# Patient Record
Sex: Female | Born: 1975
Health system: Southern US, Community
[De-identification: ages and names within clinical notes are randomized; demographics above are authoritative.]

## PROBLEM LIST (undated history)

## (undated) DIAGNOSIS — N809 Endometriosis, unspecified: Secondary | ICD-10-CM

## (undated) DIAGNOSIS — N979 Female infertility, unspecified: Secondary | ICD-10-CM

## (undated) DIAGNOSIS — R740 Nonspecific elevation of levels of transaminase and lactic acid dehydrogenase [LDH]: Secondary | ICD-10-CM

## (undated) DIAGNOSIS — F419 Anxiety disorder, unspecified: Secondary | ICD-10-CM

## (undated) DIAGNOSIS — E785 Hyperlipidemia, unspecified: Secondary | ICD-10-CM

## (undated) HISTORY — DX: Female infertility, unspecified: N97.9

## (undated) HISTORY — DX: Hyperlipidemia, unspecified: E78.5

## (undated) HISTORY — DX: Endometriosis, unspecified: N80.9

## (undated) HISTORY — DX: Nonspecific elevation of levels of transaminase and lactic acid dehydrogenase (ldh): R74.0

## (undated) HISTORY — DX: Anxiety disorder, unspecified: F41.9

---

## 2002-02-25 ENCOUNTER — Encounter: Admission: RE | Admit: 2002-02-25 | Discharge: 2002-02-25 | Payer: Self-pay | Admitting: Obstetrics and Gynecology

## 2002-02-25 ENCOUNTER — Encounter: Payer: Self-pay | Admitting: Obstetrics and Gynecology

## 2002-03-30 ENCOUNTER — Encounter: Admission: RE | Admit: 2002-03-30 | Discharge: 2002-03-30 | Payer: Self-pay | Admitting: Obstetrics and Gynecology

## 2002-04-08 ENCOUNTER — Encounter: Payer: Self-pay | Admitting: Obstetrics and Gynecology

## 2002-04-08 ENCOUNTER — Encounter: Admission: RE | Admit: 2002-04-08 | Discharge: 2002-04-08 | Payer: Self-pay | Admitting: Obstetrics and Gynecology

## 2002-06-11 ENCOUNTER — Other Ambulatory Visit: Admission: RE | Admit: 2002-06-11 | Discharge: 2002-06-11 | Payer: Self-pay | Admitting: Obstetrics and Gynecology

## 2002-08-20 DIAGNOSIS — N809 Endometriosis, unspecified: Secondary | ICD-10-CM

## 2002-08-20 HISTORY — DX: Endometriosis, unspecified: N80.9

## 2002-08-20 HISTORY — PX: PELVIC LAPAROSCOPY: SHX162

## 2002-09-25 ENCOUNTER — Ambulatory Visit (HOSPITAL_COMMUNITY): Admission: RE | Admit: 2002-09-25 | Discharge: 2002-09-25 | Payer: Self-pay | Admitting: Obstetrics and Gynecology

## 2002-09-25 ENCOUNTER — Encounter: Payer: Self-pay | Admitting: Obstetrics and Gynecology

## 2003-08-21 DIAGNOSIS — N979 Female infertility, unspecified: Secondary | ICD-10-CM

## 2003-08-21 HISTORY — DX: Female infertility, unspecified: N97.9

## 2003-11-11 ENCOUNTER — Other Ambulatory Visit: Admission: RE | Admit: 2003-11-11 | Discharge: 2003-11-11 | Payer: Self-pay | Admitting: Obstetrics and Gynecology

## 2004-01-20 ENCOUNTER — Ambulatory Visit (HOSPITAL_COMMUNITY): Admission: RE | Admit: 2004-01-20 | Discharge: 2004-01-20 | Payer: Self-pay | Admitting: Obstetrics and Gynecology

## 2004-04-05 ENCOUNTER — Ambulatory Visit (HOSPITAL_COMMUNITY): Admission: RE | Admit: 2004-04-05 | Discharge: 2004-04-05 | Payer: Self-pay | Admitting: Obstetrics and Gynecology

## 2004-04-25 ENCOUNTER — Inpatient Hospital Stay (HOSPITAL_COMMUNITY): Admission: AD | Admit: 2004-04-25 | Discharge: 2004-04-25 | Payer: Self-pay | Admitting: Obstetrics and Gynecology

## 2004-04-27 ENCOUNTER — Inpatient Hospital Stay (HOSPITAL_COMMUNITY): Admission: AD | Admit: 2004-04-27 | Discharge: 2004-04-28 | Payer: Self-pay | Admitting: Obstetrics and Gynecology

## 2004-05-01 ENCOUNTER — Inpatient Hospital Stay (HOSPITAL_COMMUNITY): Admission: AD | Admit: 2004-05-01 | Discharge: 2004-05-05 | Payer: Self-pay | Admitting: Obstetrics and Gynecology

## 2004-05-01 ENCOUNTER — Encounter (INDEPENDENT_AMBULATORY_CARE_PROVIDER_SITE_OTHER): Payer: Self-pay | Admitting: *Deleted

## 2005-01-29 ENCOUNTER — Other Ambulatory Visit: Admission: RE | Admit: 2005-01-29 | Discharge: 2005-01-29 | Payer: Self-pay | Admitting: Obstetrics and Gynecology

## 2005-02-01 ENCOUNTER — Encounter: Admission: RE | Admit: 2005-02-01 | Discharge: 2005-02-01 | Payer: Self-pay | Admitting: Obstetrics and Gynecology

## 2010-09-10 ENCOUNTER — Encounter: Payer: Self-pay | Admitting: Obstetrics and Gynecology

## 2010-11-20 ENCOUNTER — Other Ambulatory Visit: Payer: Self-pay | Admitting: Obstetrics and Gynecology

## 2011-01-05 NOTE — Discharge Summary (Signed)
Brittany Hunt, Brittany Hunt            ACCOUNT NO.:  [id number]   MEDICAL RECORD NO.:  [id number]          PATIENT TYPE:  INP   LOCATION:  9318                          FACILITY:  [facility]   PHYSICIAN:  Ilda Mori, M.D.   DATE OF BIRTH:  Jun 22, 1976   DATE OF ADMISSION:  05/01/2004  DATE OF DISCHARGE:  05/05/2004                                 DISCHARGE SUMMARY   FINAL DIAGNOSES:  1.  Intrauterine pregnancy at 34 and one-seventh weeks gestation.  2.  Twin pregnancy.  3.  Preeclampsia.  4.  Postoperative anemia.   PROCEDURE:  Primary low transverse cesarean section.  Surgeon:  Dr. Conley Simmonds.  Assistant:  Dr. Carrington Clamp.  Complications:  None.   This 35 year old G1 P0 presents at 69 and one-seventh weeks gestation with a  known twin pregnancy from in vitro fertilization and worsening preeclampsia.  The patient's antepartum course up to this point of course was complicated  by her history of infertility and an in vitro fertilization pregnancy  resulting in twins.  The patient was monitored throughout the pregnancy and  growth had been concordant.  The patient had an early placenta previa that  of course resolved.  Then the patient started to develop some increased  blood pressures with some protein in her urine starting around 33 weeks.  The patient was able to be monitored closely as an outpatient.  She had  normal PIH labs with some sporadically elevated blood pressures that came  down with rest.  BPPs were 10/10 on the twins and they continued to have  concordant growth with normal amniotic fluid volumes.  During her pregnancy  she expressed her desires for a cesarean section for her method of delivery.  When the patient presented to the office September 12 she reported to have  some increasing headaches and general malaise.  She had a blood pressure of  150/100 with 4+ proteinuria and about a 5-pound weight gain in the last 3  days.  On exam, the patient had 3+ lower  extremity edema with clonus on the  right-hand side of one beat.  Cervix at this time was closed and long.  A  decision was made at this time to proceed with a cesarean section secondary  to this worsening preeclampsia and the patient did understand this would be  a preterm delivery.  The patient was taken to the operating room on  May 01, 2004 by Dr. Conley Simmonds where a primary low transverse  cesarean section was performed with the delivery of twin A was a female infant  weighing 5 pounds 5 ounces with Apgars of 7 and 7.  Fluid was noted to be  clear and the baby was taken to the NICU.  Twin B weighed 4 pounds 11  ounces, was also a female infant with Apgars of 8 and 9 and was taken to the  NICU as well.  Both babies were just having some retractions.  Otherwise,  delivery went without complications.  The patient's postoperative course was  complicated by some postoperative anemia which she was started on iron for.  The  patient was continued on her magnesium sulfate for 24 hours after  delivery and then was transferred to the mother-baby unit.  The patient was  felt ready for discharge on postoperative day #4.  Was sent home on a  regular diet, told to decrease activities, told to continue her iron  supplement, was given Tylox #25 one to two q.4h. as needed for pain, was to  follow up in the office in 4 weeks.  Blood pressures at this point were in  the normal range.  The patient, of course, was to call with any increased  pain, bleeding, or any problems.   LABORATORY DATA ON DISCHARGE:  The patient had a stable hemoglobin of 6.3,  white blood cell count of 9.3, and PIH labs that continued to be within the  normal range.      MB/MEDQ  D:  05/30/2004  T:  05/30/2004  Job:  [id number]

## 2011-01-05 NOTE — Op Note (Signed)
NAME:  Brittany Hunt, Brittany Hunt                      ACCOUNT NO.:  [id number]   MEDICAL RECORD NO.:  [id number]                   PATIENT TYPE:  INP   LOCATION:  9199                                 [facility]ACILITY:  [facility]   PHYSICIAN:  Randye Lobo, M.D.                DATE OF BIRTH:  12-Dec-1975   DATE OF PROCEDURE:  05/01/2004  DATE OF DISCHARGE:                                 OPERATIVE REPORT   PREOPERATIVE DIAGNOSES:  1.  34+ weeks.  2.  Twin gestation.  3.  Preeclampsia.   POSTOPERATIVE DIAGNOSES:  1.  Intrauterine gestation at 34+1 weeks.  2.  Twin gestation.  3.  Preeclampsia.   PROCEDURE:  Primary low segment transverse cesarean section.   SURGEON:  Randye Lobo, M.D.   ASSISTANT:  Carrington Clamp, M.D.   ANESTHESIA:  Spinal.   INTRAVENOUS FLUIDS:  __________ mL Ringer's lactate.   ESTIMATED BLOOD LOSS:  800 mL.   URINE OUTPUT:  200 mL.   COMPLICATIONS:  None.   INDICATIONS FOR THE PROCEDURE:  The patient is a 35 year old gravida 1, para  0, Caucasian female at 34+ 1 weeks' gestation.  [facility] June 11, 2004 with a  known twin pregnancy from in vitro fertilization, who presented to the  office with headache and malaise.  The patient had been followed closely as  an outpatient for development of lower extremity edema and proteinuria over  the prior week.  The patient had normal PIH labs and sporadically mildly  elevated blood pressure which came down with rest.  The patient had  reassuring testing of the twins on April 27, 2004 at which time they had  APPs which were 10/10.  The estimated fetal weight for baby A on April 27, 2004 was 2408 g and had normal amniotic fluid.  Estimated fetal weight  for twin B was 2218 g and the amniotic fluid volume was also noted to be  normal.   The patient during her pregnancy had expressed a desire to proceed with a  cesarean section for delivery.  In the office on May 01, 2004, the  patient reported increasing headache  and malaise.  She had a blood pressure  of 150/100.  The patient appeared to be edematous and had had a 5 pound  weight gain over the last 3 days.  Her urine dipped positive for 4+  proteinuria.  The patient had evidence of 3+ lower extremity edema and was  hyperreflexic with clonus on the right hand side of one beat.  The cervix  was noted to be closed and fixed.   At this time, an assessment was made that the patient had preeclampsia with  elevated blood pressure and a recommendation was  made to proceed with  cesarean delivery after risks, benefits, and alternatives were discussed  with her.  The patient did understand that this would be a preterm delivery.   FINDINGS:  Baby A  was a viable female delivered at 56.  The Apgars were 7 at  one minute and 7 at five minutes.  The presentation was vertex.  There was a  nuchal cord x1.  The amniotic fluid was noted to be clear.  The newborn did  develop retractions shortly after delivery and the decision was made to  transport to the neonatal intensive care unit.  A cord pH was obtained and  is pending at this time.   Baby B is a viable female infant which delivered at 63.  The Apgars were 8  at one minute and 9 at five minutes.  Presentation as a double footling  breach.  The amniotic fluid was noted to be clear.  The cord pH again is  pending at the time of this dictation.  The newborn initially went to the  newborn nursery and then shortly thereafter began to have retractions and  was taken to the intensive care unit.   The uterus, tubes, and ovaries were noted to be normal.   SPECIMENS:  The placenta was sent to pathology.   PROCEDURE IN DETAIL:  The patient was re-identified in the maternity  admissions area.  She was taken down to the operating room where spinal  anesthetic was administered.  The patient was then placed in a supine  position with a left lateral tilt.  The abdomen was sterilely prepped and a  Foley catheter was  sterilely placed inside the bladder.  She was sterilely  draped.   The patient was tested and noted to have adequate anesthesia before creating  the Pfannenstiel incision with a scalpel.  This was carried down to the  fascia using monopolar cautery.  The fascia was then incised in the midline  with the scalpel and the incision was carried out bilaterally using the Mayo  scissors.  The rectus muscles were dissected off of the overlying fascia  superiorly and inferiorly.  The parietal peritoneum was entered sharply  after it was elevated with two hemostat clamps.  The peritoneal incision was  extended cranially and caudally.   The bladder retractor was placed over the bladder and the lower uterine  segment was exposed.  The bladder flap was created sharply.  A transverse  lower uterine segment incision was created with a scalpel.  The incision was  extended bilaterally in an upward fashion using the bandage scissors.  Membranes were ruptured and Baby A was delivered in a vertex presentation.  The nuchal cord was reduced and the remainder of the infant was delivered  without difficulty.  The nares and mouth were suctioned and the newborn was  carried over to the awaiting pediatricians after the cord was doubly clamped  and cut.  Membranes were then ruptured for Baby B which presented as a  double footling breach.  The legs were delivered followed by the torso.  The  arms were swept across the chest easily and the head was delivered without  difficulty.  Again, the nares and mouth were suctioned and the cord was  doubly clamped and cut and the newborn was carried over to the waiting  pediatricians.   Cord pHs and cord blood were obtained from both of the cords.  The umbilical  cord for Baby A was marked with an umbilical clamp.  After the placenta was  manually extracted, it was sent to pathology.  The uterus was exteriorized for its closure at this time.  A moistened lap pad was used to  remove  any  remaining products of conception from within the uterine cavity and there  were none.  The uterine incision was closed with a double layer of closure  of #1 chromic.  The first was a running locked layer and the second was an  imbricating layer.  Hemostasis was created along the serosa in the  midportion of the incision with monopolar cautery.   The uterus was returned to the peritoneal cavity which was irrigated and  suctioned.  The uterine incision was noted to be hemostatic and the abdomen  was therefore closed at this time.  The parietal peritoneum was closed with  a running suture of 3-0 Vicryl.  The rectus muscles were reapproximated in  the midline with figure-of-eight sutures of #1 chromic.  The fascia was  closed with a running suture of 0 Vicryl.  The subcutaneous tissue was  irrigated and suctioned and made hemostatic with monopolar cautery.  Staples  were placed on the skin and a pressure dressing was placed over this.   This concluded the patient's procedure.  There were no complications.  All  needle, instrument, and sponge counts were correct.  The patient is escorted  to the recovery room in stable and awake condition.                                               Randye Lobo, M.D.    BES/MEDQ  D:  05/01/2004  T:  05/01/2004  Job:  [id number]

## 2011-10-23 ENCOUNTER — Ambulatory Visit
Admission: RE | Admit: 2011-10-23 | Discharge: 2011-10-23 | Disposition: A | Discharge status: Home / Self Care | Payer: BC Managed Care – PPO | Source: Ambulatory Visit | Attending: Physician Assistant | Admitting: Physician Assistant

## 2011-10-23 ENCOUNTER — Other Ambulatory Visit: Payer: Self-pay | Admitting: Physician Assistant

## 2011-10-23 DIAGNOSIS — M549 Dorsalgia, unspecified: Secondary | ICD-10-CM

## 2011-10-23 DIAGNOSIS — R0602 Shortness of breath: Secondary | ICD-10-CM

## 2012-01-31 ENCOUNTER — Other Ambulatory Visit: Payer: Self-pay | Admitting: Obstetrics and Gynecology

## 2012-01-31 DIAGNOSIS — N63 Unspecified lump in unspecified breast: Secondary | ICD-10-CM

## 2012-02-06 ENCOUNTER — Other Ambulatory Visit: Payer: Self-pay | Admitting: Obstetrics and Gynecology

## 2012-02-06 ENCOUNTER — Ambulatory Visit
Admission: RE | Admit: 2012-02-06 | Discharge: 2012-02-06 | Disposition: A | Discharge status: Home / Self Care | Payer: BC Managed Care – PPO | Source: Ambulatory Visit | Attending: Obstetrics and Gynecology | Admitting: Obstetrics and Gynecology

## 2012-02-06 DIAGNOSIS — N63 Unspecified lump in unspecified breast: Secondary | ICD-10-CM

## 2012-02-07 ENCOUNTER — Other Ambulatory Visit: Payer: Self-pay | Admitting: Obstetrics and Gynecology

## 2012-02-07 ENCOUNTER — Ambulatory Visit
Admission: RE | Admit: 2012-02-07 | Discharge: 2012-02-07 | Disposition: A | Discharge status: Home / Self Care | Payer: BC Managed Care – PPO | Source: Ambulatory Visit | Attending: Obstetrics and Gynecology | Admitting: Obstetrics and Gynecology

## 2012-02-07 ENCOUNTER — Other Ambulatory Visit: Payer: BC Managed Care – PPO

## 2012-02-07 DIAGNOSIS — N63 Unspecified lump in unspecified breast: Secondary | ICD-10-CM

## 2012-02-07 LAB — HM MAMMOGRAPHY

## 2012-02-08 ENCOUNTER — Other Ambulatory Visit: Payer: Self-pay | Admitting: Obstetrics and Gynecology

## 2012-02-08 ENCOUNTER — Ambulatory Visit
Admission: RE | Admit: 2012-02-08 | Discharge: 2012-02-08 | Disposition: A | Discharge status: Home / Self Care | Payer: BC Managed Care – PPO | Source: Ambulatory Visit | Attending: Obstetrics and Gynecology | Admitting: Obstetrics and Gynecology

## 2012-02-08 ENCOUNTER — Other Ambulatory Visit: Payer: BC Managed Care – PPO

## 2012-02-08 DIAGNOSIS — N63 Unspecified lump in unspecified breast: Secondary | ICD-10-CM

## 2012-02-25 ENCOUNTER — Other Ambulatory Visit: Payer: BC Managed Care – PPO

## 2012-11-13 ENCOUNTER — Encounter: Payer: Self-pay | Admitting: Obstetrics and Gynecology

## 2012-11-13 ENCOUNTER — Ambulatory Visit (INDEPENDENT_AMBULATORY_CARE_PROVIDER_SITE_OTHER): Payer: BC Managed Care – PPO | Admitting: Obstetrics and Gynecology

## 2012-11-13 ENCOUNTER — Other Ambulatory Visit: Payer: Self-pay | Admitting: Obstetrics and Gynecology

## 2012-11-13 VITALS — BP 110/78 | Ht 64.0 in | Wt 144.0 lb

## 2012-11-13 DIAGNOSIS — N926 Irregular menstruation, unspecified: Secondary | ICD-10-CM

## 2012-11-13 DIAGNOSIS — Z23 Encounter for immunization: Secondary | ICD-10-CM

## 2012-11-13 DIAGNOSIS — N6320 Unspecified lump in the left breast, unspecified quadrant: Secondary | ICD-10-CM

## 2012-11-13 DIAGNOSIS — Z0000 Encounter for general adult medical examination without abnormal findings: Secondary | ICD-10-CM

## 2012-11-13 DIAGNOSIS — N939 Abnormal uterine and vaginal bleeding, unspecified: Secondary | ICD-10-CM

## 2012-11-13 DIAGNOSIS — N63 Unspecified lump in unspecified breast: Secondary | ICD-10-CM

## 2012-11-13 DIAGNOSIS — Z01419 Encounter for gynecological examination (general) (routine) without abnormal findings: Secondary | ICD-10-CM

## 2012-11-13 LAB — POCT URINALYSIS DIPSTICK
Blood, UA: NEGATIVE
Glucose, UA: NEGATIVE
Ketones, UA: NEGATIVE
Leukocytes, UA: NEGATIVE
Protein, UA: NEGATIVE

## 2012-11-13 MED ORDER — MEDROXYPROGESTERONE ACETATE 5 MG PO TABS: 5.0000 mg | ORAL_TABLET | Freq: Every day | ORAL | Status: DC

## 2012-11-13 MED ORDER — NORETHIN-ETH ESTRAD-FE BIPHAS 1 MG-10 MCG / 10 MCG PO TABS: 1.0000 | ORAL_TABLET | Freq: Every day | ORAL | Status: DC

## 2012-11-13 NOTE — Progress Notes (Signed)
Patient ID: Brittany Hunt, female   DOB: 1976/07/08, 37 y.o.   MRN: [id number]  37 y.o. MarriedCaucasian female   G2P2 here for annual exam.   Stopped OCPs 1 and 1/2 years ago - Yaz and Yasmin.  Was having chest discomfort.  Patient was stressed and anxious.  Later restarted OCPs and was on Tri-Legest Fe and did OK without symptoms.  Cycles are lengthening.  Patient noted a left breast lump last summer.  [facility] Result benign breast parenchyema.favor pseudoangiomatous stromal hyperplasia or hamartomatous lesion.    Patient's last menstrual period was 10/13/2012.          Sexually active: yes  The current method of family planning is rhythm method.    Exercising: jogging. Last mammogram:  June 2013 with ultrasound and biopsy.   SBE - does regularly. Last pap smear:  2012 - normal History of abnormal pap:   Remote history at about age 17.  Had colposcopy at [facility] [place] and follow up paps normal. Smoking:  No. Alcohol: 3 drinks per week. Last colonoscopy:  Never. Last Bone Density:  Never. Last tetanus shot:  Uncertain. Last cholesterol check:  Does with PCP - Corky Mull.  Normal.    Hgb:                Urine:  Normal.    No health maintenance topics applied.  Family History  Problem Relation Age of Onset  . Diabetes Mother   . Thyroid disease Mother   . Thyroid disease Sister   . Heart failure Maternal Grandmother   . Heart disease Paternal Grandmother     There is no problem list on file for this patient.   Past Medical History  Diagnosis Date  . Anxiety   . Endometriosis 2004  . Infertility, female 22    INVETRO @ [facility]    Past Surgical History  Procedure Laterality Date  . Cesarean section  2005    [facility]  . Pelvic laparoscopy  2004    Allergies: Codeine  Current Outpatient Prescriptions  Medication Sig Dispense Refill  . diphenhydramine-acetaminophen (TYLENOL PM) 25-500 MG TABS Take 1 tablet  by mouth at bedtime as needed.      . vitamin C (ASCORBIC ACID) 500 MG tablet Take 500 mg by mouth daily.       No current facility-administered medications for this visit.    ROS: Pertinent items are noted in HPI.  Exam:    BP 110/78  Ht 5\' 4"  (1.626 m)  Wt 144 lb (65.318 kg)  BMI 24.71 kg/m2  LMP 10/13/2012   Wt Readings from Last 3 Encounters:  11/13/12 144 lb (65.318 kg)     Ht Readings from Last 3 Encounters:  11/13/12 5\' 4"  (1.626 m)    General appearance: alert, cooperative and appears stated age Head: Normocephalic, without obvious abnormality, atraumatic Neck: no adenopathy, supple, symmetrical, trachea midline and thyroid not enlarged, symmetric, no tenderness/mass/nodules Lungs: clear to auscultation bilaterally Breasts:Right -  Inspection negative, No nipple retraction or dimpling, No nipple discharge or bleeding, No axillary or supraclavicular adenopathy, Normal to palpation without dominant masses.  Left - palpable dense defined tissue at 12 o'clock, 2.5 cm diameter, smooth, mobile.  No retractions or axillary adenopathy.  (Patient is status post benign biopsy.) Heart: regular rate and rhythm Abdomen: soft, non-tender; bowel sounds normal; no masses,  no organomegaly Extremities: extremities normal, atraumatic, no cyanosis or edema Skin: Skin  color, texture, turgor normal. No rashes or lesions Lymph nodes: Cervical, supraclavicular, and axillary nodes normal. No abnormal inguinal nodes palpated Neurologic: Grossly normal   Pelvic: External genitalia:  no lesions              Urethra:  normal appearing urethra with no masses, tenderness or lesions              Bartholins and Skenes: normal                 Vagina: normal appearing vagina with normal color and discharge, no lesions              Cervix: normal appearance              Pap taken: yes and HR HPV        Bimanual Exam:  Uterus:  uterus is normal size, shape, consistency and nontender                                       Adnexa: normal adnexa in size, nontender and no masses                                      Rectovaginal: Confirms                                      Anus:  normal sphincter tone, no lesions  UPT - negative.  A: well woman Left breast mass.  Status post biopsy with benign results. PMS symptoms. Abnormal menses. Family history of thyroid disease.     P: mammogram yearly.  Diagnostic mammogram ordered for June 2014. pap smear and HR HPV testing. Provera 5 mg po q day for 5 days to start menses. Then start  Lo LoEstrin fe.  2 sample packs.  Rx for 10 months. Check TSH. Tdap today. Return annually or prn     An After Visit Summary was printed and given to the patient.

## 2012-11-13 NOTE — Patient Instructions (Signed)

## 2012-11-18 ENCOUNTER — Telehealth: Payer: Self-pay | Admitting: Obstetrics and Gynecology

## 2012-11-18 NOTE — Telephone Encounter (Signed)
Spoke with Sunny Schlein at [facility] concerning order for patient. Bilateral Breast Diagnostic Mammogram. States patient can not have Bilateral done until August 2014. States that patient never returned for Left Breast Ultrasound last year 01/2012. Order needs to be put in for Left Breast Ultra Sound (Breast Asymmetry). Order needs to be put in for Left Breast Diagnostic Mammogram also. Please advise.  sue

## 2012-11-18 NOTE — Telephone Encounter (Signed)
Cara from the [facility] is calling about the order placed in epic for this patient. Please call.

## 2012-11-19 ENCOUNTER — Telehealth: Payer: Self-pay | Admitting: *Deleted

## 2012-11-19 LAB — IPS PAP TEST WITH HPV

## 2012-11-19 NOTE — Telephone Encounter (Signed)
Plan for bilateral diagnostic mammogram and left breast ultrasound as discussed with the [facility] on the phone now.

## 2012-11-19 NOTE — Telephone Encounter (Signed)
Patient notified of appointment at the [facility] for Bilateral Diagnostic Mammogram and Left Breast Ultrasound  For February 16, 2013 @ 8am. sue

## 2012-11-19 NOTE — Telephone Encounter (Signed)
Spoke with Brittany Hunt at [facility] and Dr. Edward Jolly discussed with Brittany Hunt the need for order of Bilateral Diagnostic Mammogram and Left Breast Ultrasound to be done. Brittany Hunt researched patient chart through [facility] along with Dr. Edward Jolly and agreed these test were due to be done in June, 2014, patient scheduled for 02/16/2013 @ 8:00am @ [facility]. Patient notified of this.

## 2012-11-28 ENCOUNTER — Encounter: Payer: Self-pay | Admitting: Obstetrics and Gynecology

## 2012-12-02 ENCOUNTER — Telehealth: Payer: Self-pay | Admitting: Obstetrics and Gynecology

## 2012-12-02 NOTE — Telephone Encounter (Signed)
Pt called and she wants to go over the test results again just to make sure she is 100% clear on what they mean. Please call the patient.

## 2012-12-03 NOTE — Telephone Encounter (Signed)
Spoke with patient to answer any questions she had about pap results.  She states she feels much better after Reviewing Dr. Rica Records notes on my chart.  She didn't have any further questions.  Pt. Reassured.

## 2012-12-22 NOTE — Telephone Encounter (Signed)
Patient started new birth control pill and is having breakthrough bleeding/[place]

## 2012-12-22 NOTE — Telephone Encounter (Signed)
Brittany Hunt,  The breakthrough bleeding is not uncommon due to the missed pill, especially because patient is on an ultralow dose pill.  I recommend she take two pills a day for the rest of the pack, skip the placebo pills at the end, and then go directly into the next pack.  If she has difficulty with recurrent breakthrough bleeding, we may want to consider having her try a regular low dose pill with 20 mcg of estrogen in it.  Thanks,  [employer]

## 2012-12-22 NOTE — Telephone Encounter (Signed)
Patient was started on a low dose of new birth control pills. Started on pack 2 day 10 with break free bleeding like a normal cycle. Using maxipad x 2-3 days. Patient having no other symptoms. States has started a new exorcize program routine. Also stated she did miss a pill last Thursday but took the next morning when remembered and started the break through bleeding on the Friday. Please advise. sue

## 2012-12-23 NOTE — Telephone Encounter (Signed)
Patient notified of Dr. Edward Jolly response and instructions of taking of pills. Patient will call back if any problems. sue

## 2013-01-02 ENCOUNTER — Other Ambulatory Visit: Payer: Self-pay | Admitting: Obstetrics and Gynecology

## 2013-01-02 ENCOUNTER — Telehealth: Payer: Self-pay | Admitting: Obstetrics and Gynecology

## 2013-01-02 MED ORDER — NORETHIN ACE-ETH ESTRAD-FE 1-20 MG-MCG PO TABS: 1.0000 | ORAL_TABLET | Freq: Every day | ORAL | Status: DC

## 2013-01-02 NOTE — Telephone Encounter (Signed)
VM confirms correct name/LM new RX has been sent.call back if questions.

## 2013-01-02 NOTE — Telephone Encounter (Signed)
I will send a new Rx to her pharmacy to take LoEstrin 1/20.  Thanks,  [employer]

## 2013-01-02 NOTE — Telephone Encounter (Signed)
Patient having breakthrough bleeding. Please advise.

## 2013-01-02 NOTE — Telephone Encounter (Signed)
Patient reports BTB on "low dose pill"  And states that BTB stopped when she was taking the two OCP per day as Dr  Edward Jolly instructed her to do. Then once back to daily pill, BTB started 4 days later.  She has taken a second pill today and would like to have higher dose pill.

## 2013-02-16 ENCOUNTER — Telehealth: Payer: Self-pay | Admitting: Obstetrics and Gynecology

## 2013-02-16 NOTE — Telephone Encounter (Signed)
Spoke with pt about anxiety, irritability, and feeling on edge the week before her cycle starts. Symptoms are getting worse the older she gets. Pt wondering if there is anything BS would recommend for her to take.

## 2013-02-16 NOTE — Telephone Encounter (Signed)
Please have Brittany Hunt come in to discuss possible treatment for PMS symptoms.

## 2013-02-16 NOTE — Telephone Encounter (Signed)
Patient says she feels terrible the week before her cycle. Please call

## 2013-02-17 NOTE — Telephone Encounter (Signed)
Please contact patient to see if she has taken an SSRI in the past for PMS symptoms.  If she has taken and wants to retry this, I will prescribe by phone.  If has not taken this in the past, she will need an appointment to discuss and review medication with her.  I now see patients at 7:00 am for appointments.  Thank you,  Conley Simmonds

## 2013-02-17 NOTE — Telephone Encounter (Signed)
Call to patient, states she has not been on SSRI and is really not sure she wants this option.  Has heard many mixed messages about this.  States she is on OCP for cycle regulation and this is second month on higher pill due to BTB on LoLoestrin.  She states this is just like previous times on pills where she is miserable the week before cycle. So bad she almost wants to just stop pill.  Reports that off OCP, cycles are as frequent as Q21 days.  Advised that short of restarting a medication that she may have taken  In the past, she really needs to come in and discuss with Dr Edward Jolly the many different options.  Perhaps instead of treating PMS, we could look at treating her cycles, perhaps Mirena.  MD needs to be able to be able to explain all her options and let her choose.  Patient insistent that she simply does not have time due to work and vacation.  Advised that we have appts as early as 7 am.  If she really not happy with pill, could just finish this pack and not restart next pack and wait to come in to discuss.  Patient states she will see what she can work out and call back.

## 2013-02-17 NOTE — Telephone Encounter (Signed)
Spoke with pt about BS advice to come in and discuss options for PMS. Pt replied, "Oh no. I don't have time to come in. I live in [place] and just don't have much time off." Advised I would route the message back to Dr. Edward Jolly.

## 2013-04-14 ENCOUNTER — Ambulatory Visit
Admission: RE | Admit: 2013-04-14 | Discharge: 2013-04-14 | Disposition: A | Discharge status: Home / Self Care | Payer: BC Managed Care – PPO | Source: Ambulatory Visit | Attending: Obstetrics and Gynecology | Admitting: Obstetrics and Gynecology

## 2013-04-14 ENCOUNTER — Other Ambulatory Visit: Payer: Self-pay | Admitting: Obstetrics and Gynecology

## 2013-04-14 DIAGNOSIS — N6320 Unspecified lump in the left breast, unspecified quadrant: Secondary | ICD-10-CM

## 2013-06-25 ENCOUNTER — Other Ambulatory Visit: Payer: Self-pay

## 2014-03-25 IMAGING — MG MM BREAST STEREO BIOPSY LEFT
3 series · 3 of 3 positions shown · non-contrast
Comparison: Previous exams

***ADDENDUM*** CREATED: 02/08/2012 [DATE]
CLINICAL DATA: The patient presents for stereotactic guided core
biopsy of asymmetry in the left breast.

STEREOTACTIC-GUIDED VACUUM ASSISTED BIOPSY OF THE LEFT BREAST AND
SPECIMEN RADIOGRAPH

[L CC (1 of 2)]
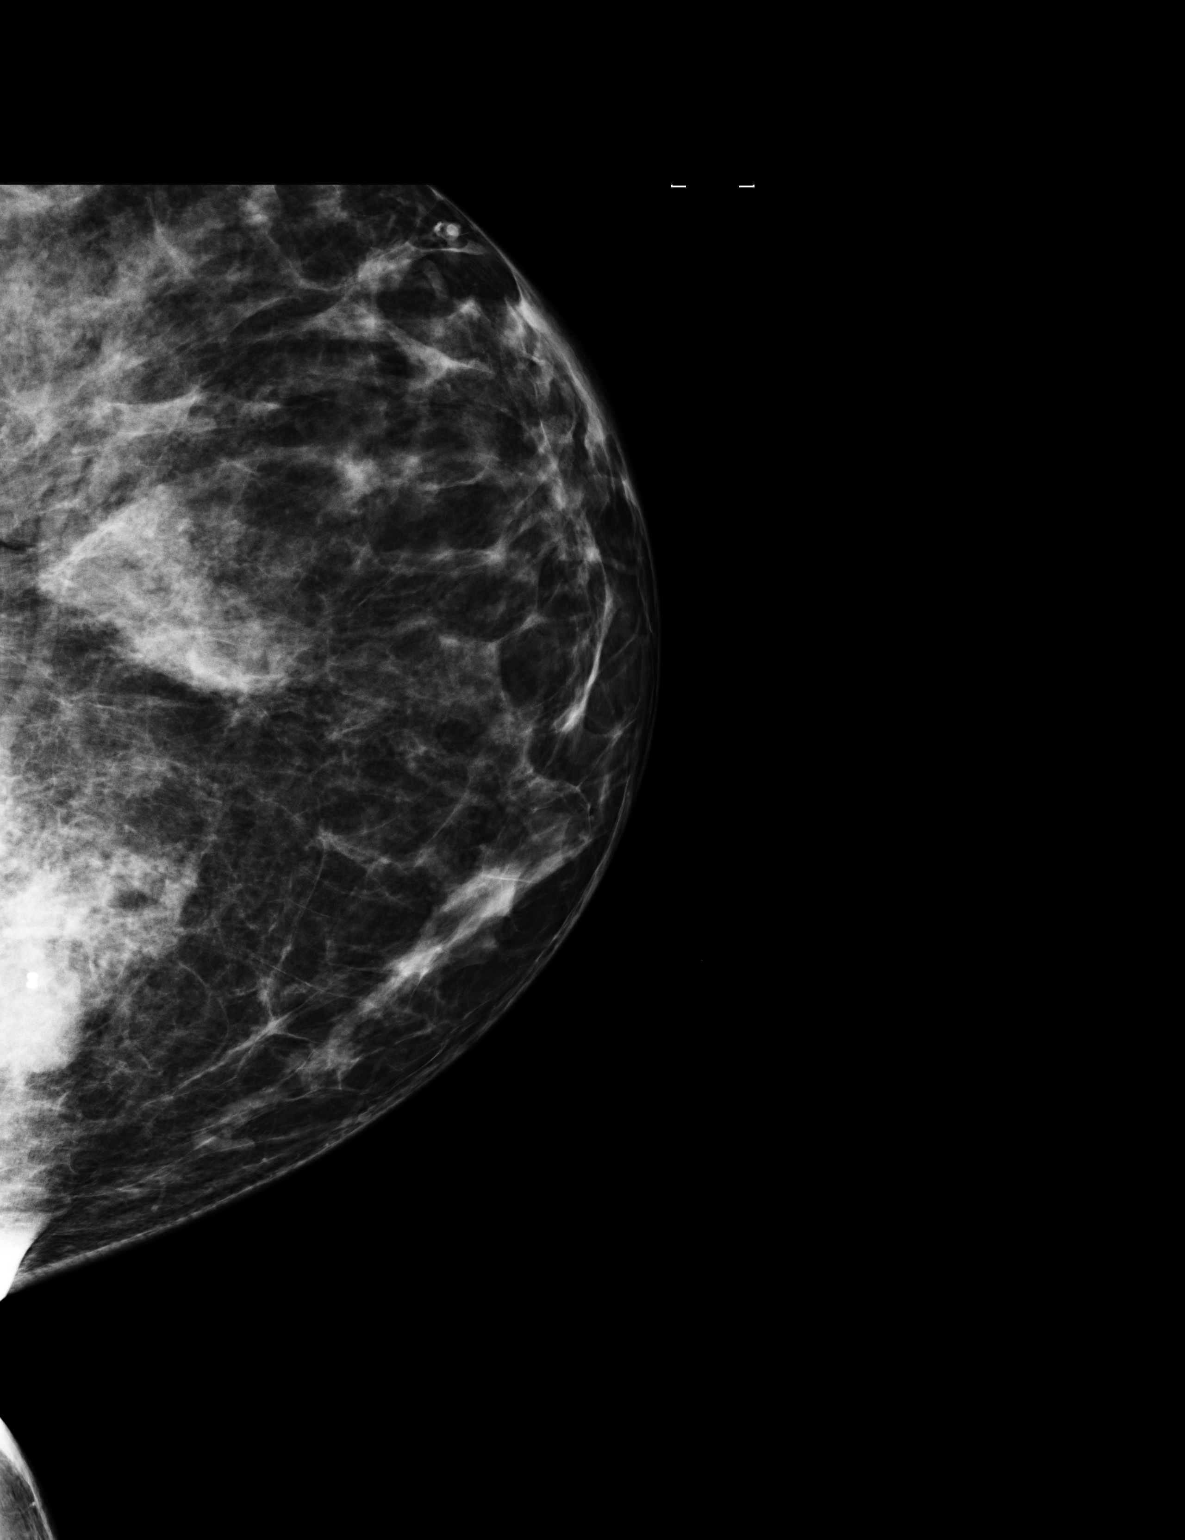

[L CC (2 of 2)]
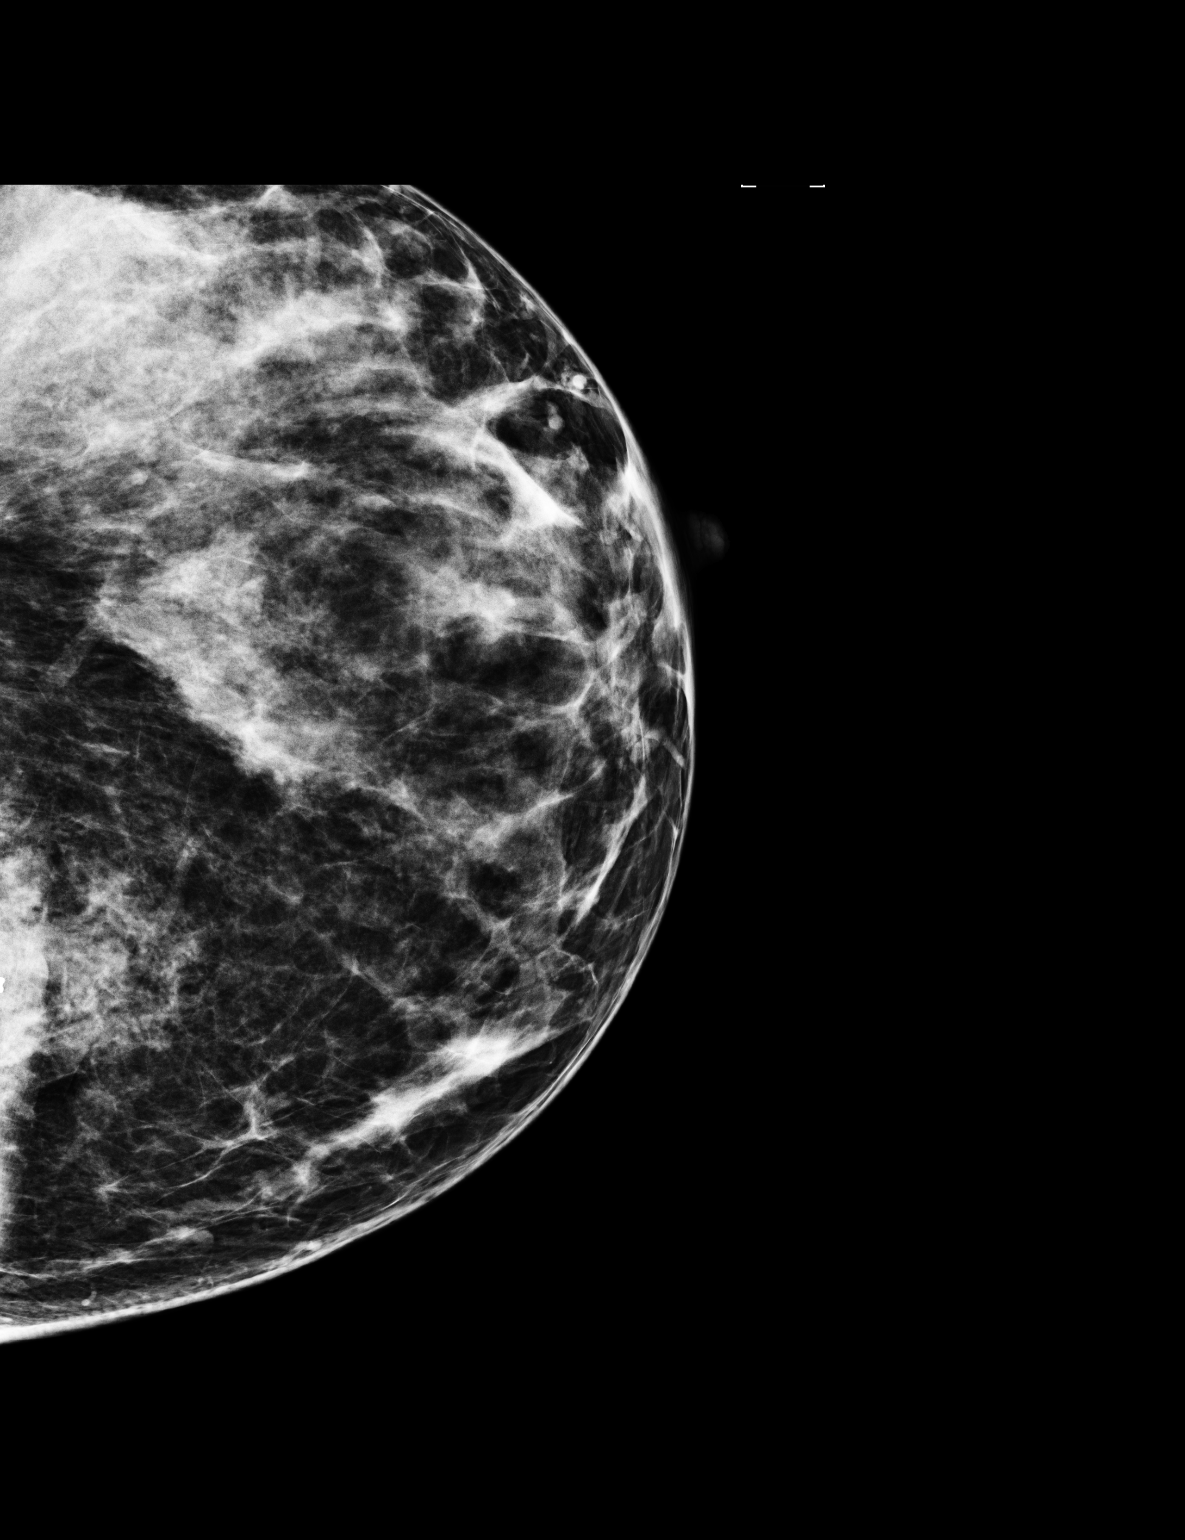

[L ML]
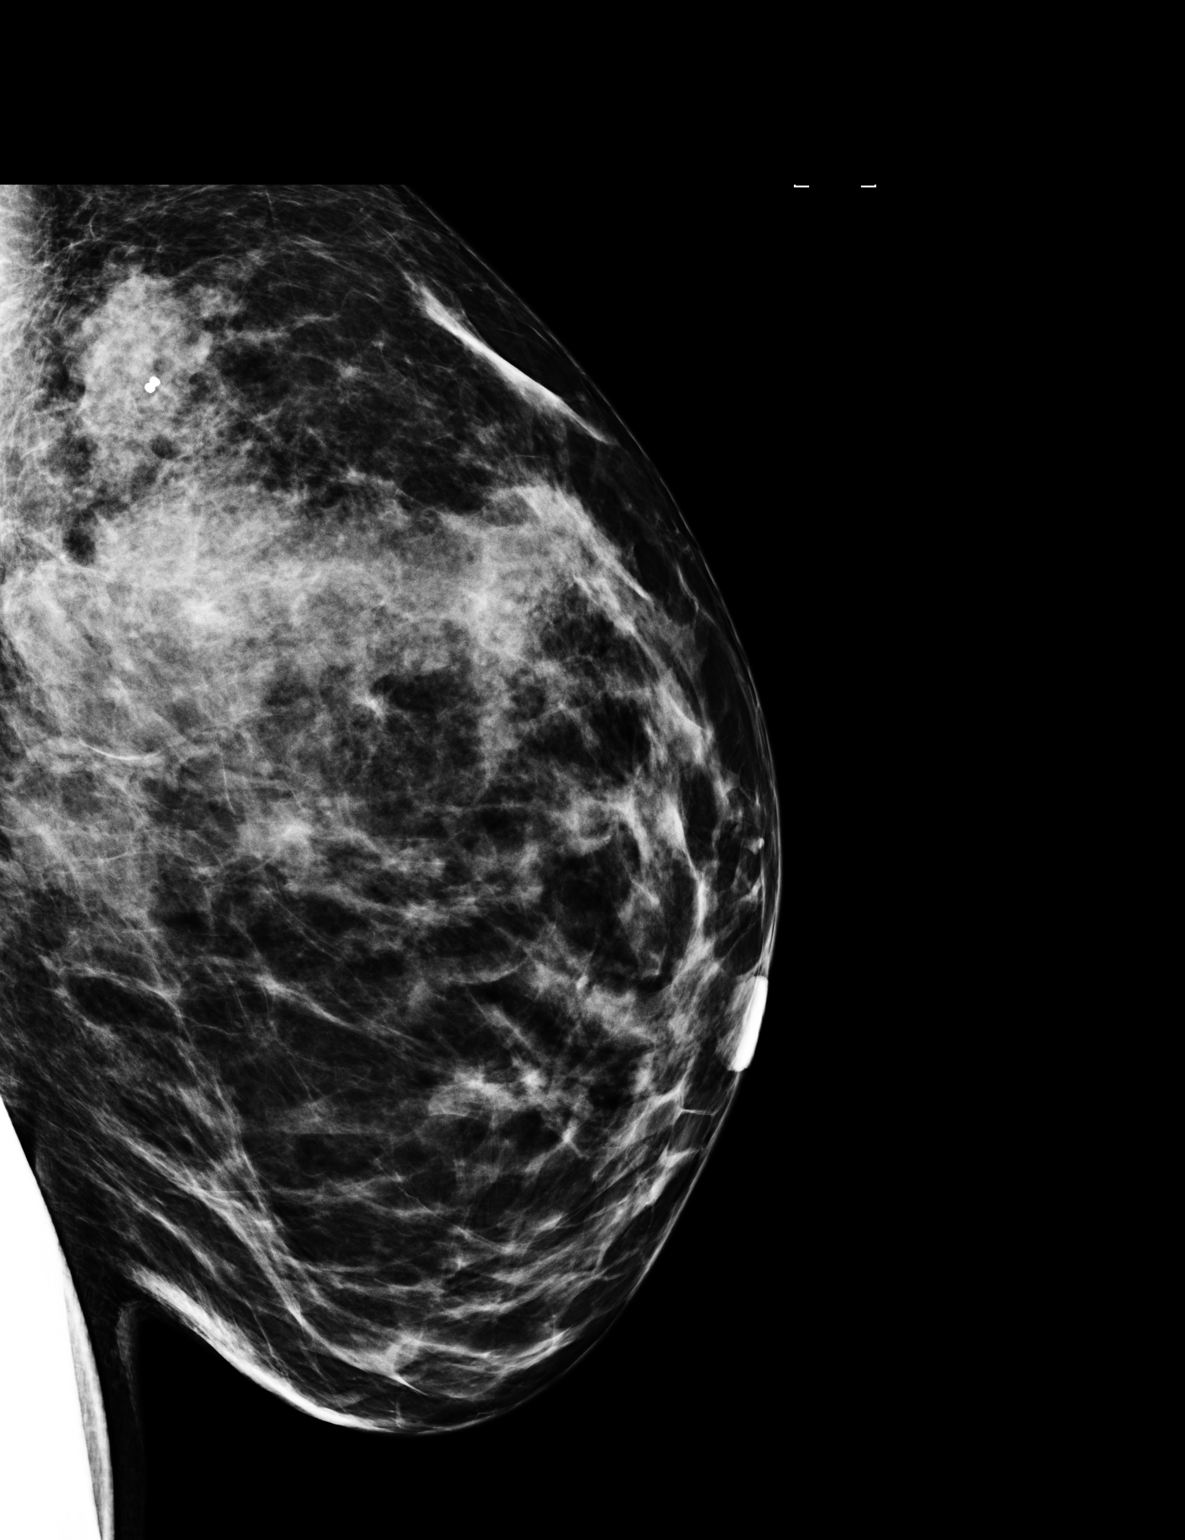

[3 of 3 positions shown; findings below may reference images not displayed]

FINDINGS: Post biopsy clip mammogram images of the left breast
were obtained when the patient returned for biopsy results on
02/08/2012.  These demonstrate a dumbbell shaped clip within the
previously demonstrated area of asymmetrical density.
IMPRESSION: Appropriate clip deployment following left breast stereotactic
guided core needle biopsy.

RECOMMENDATION:
Bilateral screening mammogram at age 38.

BI-RADS CATEGORY 2:  Benign finding(s).
I met with the patient and we discussed the procedure of
stereotactic-guided biopsy, including benefits and alternatives.
We discussed the high likelihood of a successful procedure. We
discussed the risks of the procedure, including infection,
bleeding, tissue injury, clip migration, and inadequate sampling.
Informed, written consent was given.

Using sterile technique, 2% lidocaine, stereotactic guidance, and a
9 gauge vacuum assisted device, biopsy was performed of asymmetry
in the medial portion of the left breast using a cephalad approach.
Compression was applied to measuring the stasis.  The patient has a
small palpable hematoma at the biopsy site.

At the conclusion of the procedure, a tissue marker clip was
deployed into the biopsy cavity. Clip films are deferred until
tomorrow because of small hematoma at the biopsy site.
IMPRESSION: Stereotactic-guided biopsy of left breast asymmetry.  No apparent
complications.

## 2014-06-21 ENCOUNTER — Encounter: Payer: Self-pay | Admitting: Obstetrics and Gynecology

## 2014-08-17 ENCOUNTER — Telehealth: Payer: Self-pay | Admitting: Obstetrics and Gynecology

## 2014-08-17 NOTE — Telephone Encounter (Signed)
Pt says she is not sure if Dr Edward JollySilva wanted to see her back in a year or five years for another exam.

## 2014-08-17 NOTE — Telephone Encounter (Signed)
Spoke with patient. Advised patient per last OV with Dr.Silva she recommends she return annually. Patient is agreeable and would like to schedule at this time. Requesting appointment for late April. Appointment scheduled for 12/16/14 at  8am. Patient agreeable to date and time.  Routing to provider for final review. Patient agreeable to disposition. Will close encounter

## 2014-08-20 DIAGNOSIS — E785 Hyperlipidemia, unspecified: Secondary | ICD-10-CM

## 2014-08-20 HISTORY — DX: Hyperlipidemia, unspecified: E78.5

## 2014-08-23 ENCOUNTER — Other Ambulatory Visit: Payer: Self-pay | Admitting: Obstetrics and Gynecology

## 2014-08-23 NOTE — Telephone Encounter (Signed)
Medication refill request: Junel Fe Last AEX:  11/13/12 Next AEX: 12/16/14 Last MMG (if hormonal medication request): 04/14/13 BIRADS2:Benign Refill authorized: #1pack/ 3R

## 2014-08-23 NOTE — Telephone Encounter (Signed)
Please move up the patient's appointment with me. I would like to see her in the next month.

## 2014-08-23 NOTE — Telephone Encounter (Signed)
eScribe request from [facility] for refill on LOESTRIN FE 1/20 Last filled - 01/02/13, #1 X 10 refills Last AEX - 11/13/12 Next AEX - 12/16/14 Last MMG - 04/14/13, diagnostic; repeat in one year or at age 40.  Pt turned 38 in December. Refills authorized:  1 pack x 3 refills

## 2014-08-24 ENCOUNTER — Telehealth: Payer: Self-pay | Admitting: Obstetrics and Gynecology

## 2014-08-24 ENCOUNTER — Other Ambulatory Visit: Payer: Self-pay

## 2014-08-24 ENCOUNTER — Other Ambulatory Visit: Payer: Self-pay | Admitting: Obstetrics and Gynecology

## 2014-08-24 DIAGNOSIS — Z1231 Encounter for screening mammogram for malignant neoplasm of breast: Secondary | ICD-10-CM

## 2014-08-24 MED ORDER — NORETHIN ACE-ETH ESTRAD-FE 1-20 MG-MCG PO TABS: 1.0000 | ORAL_TABLET | Freq: Every day | ORAL | Status: DC

## 2014-08-24 NOTE — Telephone Encounter (Signed)
Last annual 11/13/12 Next scheduled 11/2014 with Dr. Edward JollySilva for annual exam.   Patient states that she stopped ocp about 8 Months ago to see how her body was react to being off all medications. Patient states everything was just fine (cycles regular, q 24-26 days, normal flow) until 07/2014 when she had one heavy cycle 08/07/14 and then had another cycle 08/21/14, regular flow. Patient reports that she is now running every day and has been having a lot of stress with sister who is terminal.   Patient has annual exam 11/2014 with Dr. Edward JollySilva but does not want to continue with heavy and frequent cycles. Would like to restart ocp. Patient has new rx that was sent by our office, but states insurance does not cover 1 month at a time and needs rx for 3 month supply.   Patient is scheduled for office visit with Dr. Edward JollySilva 09/24/14 0945 to discuss. Would like 3 month ocp supply ordered.

## 2014-08-24 NOTE — Telephone Encounter (Signed)
Spoke with patient. She is agreeable.  [facility] [place].  Patient will start ocp with next cycle.  She would like to have thyroid checked at next visit, advised would note that in appointment information and she can discuss with Dr. Edward JollySilva at time of visit.  Patient agreeable to plan. Will call back with any increase in symptoms. Routing to provider for final review. Patient agreeable to disposition. Will close encounter

## 2014-08-24 NOTE — Telephone Encounter (Signed)
Pt states she has questions about her abnormal period. In December it was heavy with clotting ( clotting for 1day) and 14 days later started again but normal. Pt recently found out her sister has terminal lung cancer and pt also started new exercise routine recently. Thinks those stress/life changes might have contributed to this but wants to make sure. Pt sees Dr Edward JollySilva. Bf

## 2014-08-24 NOTE — Telephone Encounter (Signed)
Ok for 3 month supply of OCPs. Please send in to her pharmacy.

## 2014-08-26 ENCOUNTER — Ambulatory Visit: Payer: Self-pay

## 2014-08-27 ENCOUNTER — Telehealth: Payer: Self-pay | Admitting: Emergency Medicine

## 2014-08-27 NOTE — Telephone Encounter (Signed)
Out going call to patient. Call to reschedule appointment due to mistaken overbook of this RN to schedule.   Patient agreeable to r/s appointment. Needs mornings only. Scheduled for 09/29/14 at 1030.   Routing to provider for final review. Patient agreeable to disposition. Will close encounter

## 2014-09-02 ENCOUNTER — Telehealth: Payer: Self-pay | Admitting: Obstetrics and Gynecology

## 2014-09-02 NOTE — Telephone Encounter (Signed)
I do not see telephone note from anyone in our office. Patient has appointment with Dr.Silva scheduled for 09/29/14 at 10:30am. Has been notified of all past results. No current refill or other encounters open. Patient requests no return call.  Routing to provider for final review. Patient agreeable to disposition. Will close encounter

## 2014-09-02 NOTE — Telephone Encounter (Signed)
Pt has appt on 09/29/14 to see Dr. Edward JollySilva.  May need one more pack until that time.

## 2014-09-02 NOTE — Telephone Encounter (Signed)
Pt states she received a missed call from our office with no message left. Pt is confused why she received a call. Her next upcoming appointment is not until February.  Pt requests call back only if there is a reason we called her originally.  Pt requests we take out her home number and only use her mobile number. Updating now. bf

## 2014-09-24 ENCOUNTER — Ambulatory Visit: Payer: Self-pay | Admitting: Obstetrics and Gynecology

## 2014-09-27 ENCOUNTER — Telehealth: Payer: Self-pay | Admitting: Obstetrics and Gynecology

## 2014-09-27 NOTE — Telephone Encounter (Signed)
Patient cancelled her upcoming visit 09/29/13 with Dr. Edward JollySilva for irregular bleeding. She said, "Since starting the birth control my symptoms have gotten much better so I don't think I need the appointment."

## 2014-09-27 NOTE — Telephone Encounter (Signed)
Dr. Edward JollySilva,  Patient restarted OCPs in 08/2014 for heavy irregular periods.  Cycles much better since restarting OCPs and Has appointment for AEX 12-16-14.  Can she just keep AEX Appointment for f/u since she's doing well on OCPs?

## 2014-09-28 NOTE — Telephone Encounter (Signed)
OK to just keep appointment for annual exam with me in April! I am glad she is doing better.

## 2014-09-29 ENCOUNTER — Ambulatory Visit: Payer: Self-pay | Admitting: Obstetrics and Gynecology

## 2014-10-15 ENCOUNTER — Other Ambulatory Visit: Payer: Self-pay | Admitting: Obstetrics and Gynecology

## 2014-10-15 NOTE — Telephone Encounter (Signed)
rx was already sent for 3packs with 0 refills thru triage 1/16

## 2014-10-28 NOTE — Telephone Encounter (Signed)
Encounter closed by me.  Thank you.

## 2014-10-28 NOTE — Telephone Encounter (Signed)
Okay to close encounter?  °

## 2014-11-10 ENCOUNTER — Other Ambulatory Visit: Payer: Self-pay | Admitting: Obstetrics and Gynecology

## 2014-11-10 NOTE — Telephone Encounter (Signed)
Medication refill request: Junel Last AEX: 11/13/12 Dr. Edward JollySilva Next AEX: 12/16/14 Dr. Edward JollySilva Last MMG (if hormonal medication request): 04/14/13 Diagnostic bilateral BIRADS2:Benign  Refill authorized: 08/24/14 #[id number]/[id number]. Today please advise.

## 2014-11-11 NOTE — Telephone Encounter (Signed)
Please keep appointment for further birth control pill refills.

## 2014-12-15 ENCOUNTER — Other Ambulatory Visit: Payer: Self-pay | Admitting: Obstetrics and Gynecology

## 2014-12-15 NOTE — Telephone Encounter (Signed)
Rx denied patient had appointment scheduled for 12/16/14, patient rescheduled AEX for 04/21/15 (see phone note from Dr. Edward JollySilva below)  Routed to Dr. Marjorie SmolderSilva FYI   Brittany SallesBrook E Amundson C Silva, MD at 11/11/2014 3:47 AM     Status: Signed       Expand All Collapse All   Please keep appointment for further birth control pill refills.

## 2014-12-15 NOTE — Telephone Encounter (Signed)
Please let Brittany Hunt know that I really need and want to see her for her annual exam. (Please say hello!) I cannot continue to refill this Rx.  I am sure we can fit her in soon.  Please review calendar again.

## 2014-12-15 NOTE — Telephone Encounter (Signed)
Patient is requesting a refill for Junel fe to be [facility] to [facility] [address] In [place].

## 2014-12-15 NOTE — Telephone Encounter (Signed)
Patient reschedule appt 12/20/14 at 10:30am.

## 2014-12-15 NOTE — Telephone Encounter (Signed)
Dr. Edward JollySilva please advise, patient cancelled appt for 12/16/14 with you and is rescheduled for 04/21/15.

## 2014-12-16 ENCOUNTER — Ambulatory Visit: Payer: BC Managed Care – PPO | Admitting: Obstetrics and Gynecology

## 2014-12-20 ENCOUNTER — Ambulatory Visit
Admission: RE | Admit: 2014-12-20 | Discharge: 2014-12-20 | Disposition: A | Discharge status: Home / Self Care | Payer: BLUE CROSS/BLUE SHIELD | Source: Ambulatory Visit

## 2014-12-20 ENCOUNTER — Ambulatory Visit (INDEPENDENT_AMBULATORY_CARE_PROVIDER_SITE_OTHER): Payer: BLUE CROSS/BLUE SHIELD | Admitting: Obstetrics and Gynecology

## 2014-12-20 ENCOUNTER — Encounter: Payer: Self-pay | Admitting: Obstetrics and Gynecology

## 2014-12-20 VITALS — BP 120/82 | HR 88 | Resp 16 | Ht 63.75 in | Wt 148.0 lb

## 2014-12-20 DIAGNOSIS — Z0000 Encounter for general adult medical examination without abnormal findings: Secondary | ICD-10-CM | POA: Diagnosis not present

## 2014-12-20 DIAGNOSIS — Z1231 Encounter for screening mammogram for malignant neoplasm of breast: Secondary | ICD-10-CM

## 2014-12-20 DIAGNOSIS — Z01419 Encounter for gynecological examination (general) (routine) without abnormal findings: Secondary | ICD-10-CM | POA: Diagnosis not present

## 2014-12-20 LAB — CBC
HCT: 41 % (ref 36.0–46.0)
Hemoglobin: 13.5 g/dL (ref 12.0–15.0)
MCH: 29.8 pg (ref 26.0–34.0)
MCHC: 32.9 g/dL (ref 30.0–36.0)
MCV: 90.5 fL (ref 78.0–100.0)
MPV: 9.6 fL (ref 8.6–12.4)
PLATELETS: 335 10*3/uL (ref 150–400)
RBC: 4.53 MIL/uL (ref 3.87–5.11)
RDW: 13.8 % (ref 11.5–15.5)
WBC: 8.6 10*3/uL (ref 4.0–10.5)

## 2014-12-20 LAB — COMPREHENSIVE METABOLIC PANEL
ALBUMIN: 4 g/dL (ref 3.5–5.2)
ALT: 11 U/L (ref 0–35)
AST: 14 U/L (ref 0–37)
Alkaline Phosphatase: 61 U/L (ref 39–117)
BILIRUBIN TOTAL: 0.3 mg/dL (ref 0.2–1.2)
BUN: 11 mg/dL (ref 6–23)
CALCIUM: 9.6 mg/dL (ref 8.4–10.5)
CO2: 26 meq/L (ref 19–32)
Chloride: 103 mEq/L (ref 96–112)
Creat: 0.64 mg/dL (ref 0.50–1.10)
Glucose, Bld: 78 mg/dL (ref 70–99)
Potassium: 4.3 mEq/L (ref 3.5–5.3)
Sodium: 139 mEq/L (ref 135–145)
Total Protein: 7 g/dL (ref 6.0–8.3)

## 2014-12-20 LAB — LIPID PANEL
CHOL/HDL RATIO: 3.4 ratio
CHOLESTEROL: 175 mg/dL (ref 0–200)
HDL: 51 mg/dL (ref >=46)
LDL CALC: 75 mg/dL (ref 0–99)
TRIGLYCERIDES: 244 mg/dL — AB (ref <150)
VLDL: 49 mg/dL — ABNORMAL HIGH (ref 0–40)

## 2014-12-20 LAB — POCT URINALYSIS DIPSTICK
Bilirubin, UA: NEGATIVE
Blood, UA: NEGATIVE
Glucose, UA: NEGATIVE
Ketones, UA: NEGATIVE
Leukocytes, UA: NEGATIVE
Nitrite, UA: NEGATIVE
PH UA: 5
Protein, UA: NEGATIVE
UROBILINOGEN UA: NEGATIVE

## 2014-12-20 LAB — TSH: TSH: 1.314 u[IU]/mL (ref 0.350–4.500)

## 2014-12-20 MED ORDER — NORETHIN ACE-ETH ESTRAD-FE 1-20 MG-MCG PO TABS: 1.0000 | ORAL_TABLET | Freq: Every day | ORAL | Status: DC

## 2014-12-20 NOTE — Progress Notes (Signed)
39 y.o. G2P2 Married Caucasian female here for annual exam.    Had once heavy cycle recently.  Wants to continue with OCPs.  This controls cycle length.   Wants thyroid check.   History of left breast biopsy - benign.  PASH 2013.  Quit her job.  Feeling much less stress.  Working out.   Sister with terminal lung cancer.  Took her on vacation in April.   PCP: Milus HeightNoelle Redmon    Patient's last menstrual period was 12/16/2014.          Sexually active: Yes.    The current method of family planning is OCP (estrogen/progesterone).    Exercising: Yes.    Weights 3 x weekly, walking every day Smoker:  no  Health Maintenance: Pap: 11/13/12 ASC-US. HR HPV:Neg  History of abnormal Pap:  yes MMG:  04/14/13 BIRADS2:Benign. Has one scheduled for 12/22/14  Colonoscopy: Never BMD:   Never  Result   TDaP: 2014 Screening Labs:  Hb today: 13.7, Urine today: Clear   reports that she has never smoked. She has never used smokeless tobacco. She reports that she drinks about 6.0 oz of alcohol per week. She reports that she does not use illicit drugs.  Past Medical History  Diagnosis Date  . Anxiety   . Endometriosis 2004  . Infertility, female [id number]    INVETRO @ [facility]    Past Surgical History  Procedure Laterality Date  . Cesarean section  2005    [facility]  . Pelvic laparoscopy  2004    Current Outpatient Prescriptions  Medication Sig Dispense Refill  . ALPRAZolam (XANAX) 0.25 MG tablet Take 0.25 mg by mouth at bedtime.   0  . diphenhydramine-acetaminophen (TYLENOL PM) 25-500 MG TABS Take 1 tablet by mouth at bedtime as needed.    Colleen Can. JUNEL FE 1/20 1-20 MG-MCG tablet TAKE 1 TABLET BY MOUTH DAILY. 28 tablet 0   No current facility-administered medications for this visit.    Family History  Problem Relation Age of Onset  . Diabetes Mother   . Thyroid disease Mother   . Thyroid disease Sister   . Cancer Sister     Lung  . Heart failure Maternal Grandmother   . Heart disease Paternal  Grandmother     ROS:  Pertinent items are noted in HPI.  Otherwise, a comprehensive ROS was negative.  Exam:   BP 120/82 mmHg  Pulse 88  Resp 16  Ht 5' 3.75" (1.619 m)  Wt 148 lb (67.132 kg)  BMI 25.61 kg/m2  LMP 12/16/2014    General appearance: alert, cooperative and appears stated age Head: Normocephalic, without obvious abnormality, atraumatic Neck: no adenopathy, supple, symmetrical, trachea midline and thyroid normal to inspection and palpation Lungs: clear to auscultation bilaterally Breasts: normal appearance, no masses or tenderness, Inspection negative, No nipple retraction or dimpling, No nipple discharge or bleeding, No axillary or supraclavicular adenopathy Heart: regular rate and rhythm Abdomen: soft, non-tender; bowel sounds normal; no masses,  no organomegaly Extremities: extremities normal, atraumatic, no cyanosis or edema Skin: Skin color, texture, turgor normal. No rashes or lesions Lymph nodes: Cervical, supraclavicular, and axillary nodes normal. No abnormal inguinal nodes palpated Neurologic: Grossly normal  Pelvic: External genitalia:  no lesions              Urethra:  normal appearing urethra with no masses, tenderness or lesions              Bartholins and Skenes: normal  Vagina: normal appearing vagina with normal color and discharge, no lesions              Cervix: no lesions              Pap taken: Yes.   Bimanual Exam:  Uterus:  normal size, contour, position, consistency, mobility, non-tender              Adnexa: normal adnexa and no mass, fullness, tenderness              Rectovaginal: Yes.  .  Confirms.              Anus:  normal sphincter tone, no lesions  Chaperone was present for exam.  Assessment:   Well woman visit with normal exam. FH of hypothyroidism.  History of PASH of left breast.   Plan: Yearly mammogram recommended after age 61.  Recommended self breast exam.  Pap and HR HPV as above. Discussed Calcium,  Vitamin D, regular exercise program including cardiovascular and weight bearing exercise. Labs performed.  Yes.  .   See orders. Refills given on medications.  Yes.  .  See orders.  LoEstrin 1/20 Fe for one year.  Follow up annually and prn.   After visit summary provided.

## 2014-12-20 NOTE — Patient Instructions (Signed)

## 2014-12-21 ENCOUNTER — Other Ambulatory Visit: Payer: Self-pay | Admitting: Obstetrics and Gynecology

## 2014-12-21 DIAGNOSIS — E781 Pure hyperglyceridemia: Secondary | ICD-10-CM

## 2014-12-22 ENCOUNTER — Ambulatory Visit: Payer: Self-pay

## 2014-12-23 LAB — IPS PAP TEST WITH HPV

## 2015-01-03 ENCOUNTER — Other Ambulatory Visit: Payer: Self-pay

## 2015-01-03 DIAGNOSIS — Z1231 Encounter for screening mammogram for malignant neoplasm of breast: Secondary | ICD-10-CM

## 2015-01-05 ENCOUNTER — Other Ambulatory Visit: Payer: BLUE CROSS/BLUE SHIELD

## 2015-04-21 ENCOUNTER — Ambulatory Visit: Payer: Self-pay | Admitting: Obstetrics and Gynecology

## 2015-09-14 ENCOUNTER — Other Ambulatory Visit: Payer: Self-pay | Admitting: Obstetrics and Gynecology

## 2015-09-14 NOTE — Telephone Encounter (Signed)
Medication refill request: Junel  Last AEX:  12-20-14  Next AEX: 12-30-15 Last MMG (if hormonal medication request): 12-20-14 WNL  Refill authorized: please advise

## 2015-11-21 ENCOUNTER — Ambulatory Visit
Admission: RE | Admit: 2015-11-21 | Discharge: 2015-11-21 | Disposition: A | Discharge status: Home / Self Care | Payer: BLUE CROSS/BLUE SHIELD | Source: Ambulatory Visit | Attending: Physician Assistant | Admitting: Physician Assistant

## 2015-11-21 ENCOUNTER — Other Ambulatory Visit: Payer: Self-pay | Admitting: Physician Assistant

## 2015-11-21 DIAGNOSIS — M25522 Pain in left elbow: Secondary | ICD-10-CM

## 2015-11-21 DIAGNOSIS — F39 Unspecified mood [affective] disorder: Secondary | ICD-10-CM | POA: Diagnosis not present

## 2015-11-21 DIAGNOSIS — Z23 Encounter for immunization: Secondary | ICD-10-CM | POA: Diagnosis not present

## 2015-11-21 DIAGNOSIS — Z0000 Encounter for general adult medical examination without abnormal findings: Secondary | ICD-10-CM | POA: Diagnosis not present

## 2015-12-30 ENCOUNTER — Ambulatory Visit: Payer: BLUE CROSS/BLUE SHIELD

## 2015-12-30 ENCOUNTER — Ambulatory Visit: Payer: BLUE CROSS/BLUE SHIELD | Admitting: Obstetrics and Gynecology

## 2016-01-16 ENCOUNTER — Other Ambulatory Visit: Payer: Self-pay | Admitting: Obstetrics and Gynecology

## 2016-01-17 NOTE — Telephone Encounter (Signed)
Left voice mail to call back 

## 2016-01-17 NOTE — Telephone Encounter (Signed)
Medication refill request: Junel  Last AEX:  12/20/14  Next AEX: 05/28/16 Dr. Edward JollySilva  Last MMG (if hormonal medication request): 12/21/14 BIRADS1:neg Refill authorized: 09/14/15 #[id number]. Today please advise.

## 2016-01-17 NOTE — Telephone Encounter (Signed)
Please move the patient's annual exam up with me. She is also due to a mammogram, I believe.

## 2016-01-18 NOTE — Telephone Encounter (Signed)
AEX rescheduled for 02/16/16 @3 :45pm.  Advised to have MMG done before appt.

## 2016-02-03 ENCOUNTER — Ambulatory Visit: Payer: BLUE CROSS/BLUE SHIELD | Admitting: Obstetrics and Gynecology

## 2016-02-16 ENCOUNTER — Ambulatory Visit: Payer: BLUE CROSS/BLUE SHIELD | Admitting: Obstetrics and Gynecology

## 2016-04-16 ENCOUNTER — Other Ambulatory Visit: Payer: Self-pay | Admitting: Obstetrics and Gynecology

## 2016-04-16 NOTE — Telephone Encounter (Signed)
Medication refill request: OCP Last AEX:  12-20-14  Next AEX: 05-23-16 Last MMG (if hormonal medication request): 12-21-14 WNL  Refill authorized: please advise  Sending to JJ since BS is out of office

## 2016-04-28 ENCOUNTER — Encounter: Payer: Self-pay | Admitting: Obstetrics and Gynecology

## 2016-05-07 NOTE — Progress Notes (Signed)
40 y.o. [id number] Married Caucasian female here for annual exam.    Some hot flashes.  Stopped OPCs in July for 2 -3 weeks and did not have a cycle. She did restart them.  Wants hormone testing.  Having decrease libido, emotional changes, night sweats.  Struggling with her weight.   Her sister had a stroke, MI and has a pacemaker. She also has DM.   Other sister has end stage lung cancer.   PCP: Milus HeightNoelle Redmon, PA-C    Patient's last menstrual period was 02/18/2016 (approximate).     Period Pattern: (!) Irregular      Sexually active: Yes.   female The current method of family planning is OCP (estrogen/progesterone) -- Junel Fe 1/20.    Exercising: Yes.    Cardio and weights Smoker:  no  Health Maintenance: Pap:  12-20-14 Neg:Neg HR HPV History of abnormal Pap:  Yes, remote history age 40. [facility] and follow up paps normal. MMG:  12-21-14 3D/Density C/Neg/BiRads1:[facility] Colonoscopy:  n/a BMD:   n/a  Result  n/a TDaP:  2014 Gardasil:   N/A   Screening Labs:  Hb today: 12.8, Urine today: Neg   reports that she has never smoked. She has never used smokeless tobacco. She reports that she drinks about 6.0 oz of alcohol per week . She reports that she does not use drugs.  Past Medical History:  Diagnosis Date  . Anxiety   . Endometriosis 2004  . Hyperlipidemia 2016  . Infertility, female [id number]   INVETRO @ [facility]    Past Surgical History:  Procedure Laterality Date  . CESAREAN SECTION  2005   [facility]  . PELVIC LAPAROSCOPY  2004    Current Outpatient Prescriptions  Medication Sig Dispense Refill  . ALPRAZolam (XANAX) 0.25 MG tablet Take 0.25 mg by mouth at bedtime.   0  . diphenhydramine-acetaminophen (TYLENOL PM) 25-500 MG TABS Take 1 tablet by mouth at bedtime as needed.    Colleen Can. JUNEL FE 1/20 1-20 MG-MCG tablet TAKE 1 TABLET BY MOUTH DAILY. 84 tablet 0   No current facility-administered medications for this visit.     Family History  Problem  Relation Age of Onset  . Diabetes Mother   . Thyroid disease Mother   . Thyroid disease Sister   . Cancer Sister     Lung  . Heart failure Maternal Grandmother   . Heart disease Paternal Grandmother   . Heart attack Sister [id number]    had pacemaker placed  . Diabetes Sister     ROS:  Pertinent items are noted in HPI.  Otherwise, a comprehensive ROS was negative.  Exam:   BP 120/70 (BP Location: Right Arm, Patient Position: Sitting, Cuff Size: Normal)   Pulse 80   Resp 16   Ht 5' 2.25" (1.581 m)   Wt 162 lb 6.4 oz (73.7 kg)   LMP 02/18/2016 (Approximate)   BMI 29.47 kg/m     General appearance: alert, cooperative and appears stated age Head: Normocephalic, without obvious abnormality, atraumatic Neck: no adenopathy, supple, symmetrical, trachea midline and thyroid normal to inspection and palpation Lungs: clear to auscultation bilaterally Breasts: normal appearance, no masses or tenderness, No nipple retraction or dimpling, No nipple discharge or bleeding, No axillary or supraclavicular adenopathy Heart: regular rate and rhythm Abdomen: soft, non-tender; no masses, no organomegaly Extremities: extremities normal, atraumatic, no cyanosis or edema Skin: Skin color, texture, turgor normal. No rashes or lesions Lymph nodes: Cervical, supraclavicular, and axillary nodes  normal. No abnormal inguinal nodes palpated Neurologic: Grossly normal  Pelvic: External genitalia:  no lesions              Urethra:  normal appearing urethra with no masses, tenderness or lesions              Bartholins and Skenes: normal                 Vagina: normal appearing vagina with normal color and discharge, no lesions              Cervix: no lesions              Pap taken: No. Bimanual Exam:  Uterus:  normal size, contour, position, consistency, mobility, non-tender              Adnexa: no mass, fullness, tenderness              Rectal exam: Yes.  .  Confirms.              Anus:  normal sphincter tone,  no lesions  Chaperone was present for exam.  Assessment:   Well woman visit with normal exam. Menopausal symptoms. Hyperlipidemia. Elevated triglycerides.  Plan: Yearly mammogram recommended after age 2.  Patient will schedule 3D.  Recommended self breast exam.  Pap and HR HPV as above. Discussed Calcium, Vitamin D, regular exercise program including cardiovascular and weight bearing exercise. Will do fasting labs and FSH/estradiol when off of OCPs for 2 weeks.  No refill on OCPs at this time.  If menopausal, may switch to HRT. MVI daily.  Discussed herbal remedies for menopausal symptoms.   Follow up annually and prn.       After visit summary provided.

## 2016-05-09 ENCOUNTER — Ambulatory Visit (INDEPENDENT_AMBULATORY_CARE_PROVIDER_SITE_OTHER): Payer: BLUE CROSS/BLUE SHIELD | Admitting: Obstetrics and Gynecology

## 2016-05-09 ENCOUNTER — Encounter: Payer: Self-pay | Admitting: Obstetrics and Gynecology

## 2016-05-09 VITALS — BP 120/70 | HR 80 | Resp 16 | Ht 62.25 in | Wt 162.4 lb

## 2016-05-09 DIAGNOSIS — Z0000 Encounter for general adult medical examination without abnormal findings: Secondary | ICD-10-CM

## 2016-05-09 DIAGNOSIS — N951 Menopausal and female climacteric states: Secondary | ICD-10-CM | POA: Diagnosis not present

## 2016-05-09 DIAGNOSIS — Z01419 Encounter for gynecological examination (general) (routine) without abnormal findings: Secondary | ICD-10-CM | POA: Diagnosis not present

## 2016-05-09 LAB — POCT URINALYSIS DIPSTICK
BILIRUBIN UA: NEGATIVE
GLUCOSE UA: NEGATIVE
Ketones, UA: NEGATIVE
Leukocytes, UA: NEGATIVE
NITRITE UA: NEGATIVE
PH UA: 5
Protein, UA: NEGATIVE
RBC UA: NEGATIVE
Urobilinogen, UA: NEGATIVE

## 2016-05-09 NOTE — Patient Instructions (Signed)

## 2016-05-10 LAB — HEMOGLOBIN, FINGERSTICK: HEMOGLOBIN, FINGERSTICK: 12.8 g/dL (ref 12.0–16.0)

## 2016-05-23 ENCOUNTER — Ambulatory Visit: Payer: BLUE CROSS/BLUE SHIELD | Admitting: Obstetrics and Gynecology

## 2016-05-24 ENCOUNTER — Other Ambulatory Visit (INDEPENDENT_AMBULATORY_CARE_PROVIDER_SITE_OTHER): Payer: BLUE CROSS/BLUE SHIELD

## 2016-05-24 DIAGNOSIS — Z01419 Encounter for gynecological examination (general) (routine) without abnormal findings: Secondary | ICD-10-CM | POA: Diagnosis not present

## 2016-05-24 DIAGNOSIS — N951 Menopausal and female climacteric states: Secondary | ICD-10-CM

## 2016-05-24 DIAGNOSIS — Z0000 Encounter for general adult medical examination without abnormal findings: Secondary | ICD-10-CM

## 2016-05-24 LAB — LIPID PANEL
CHOL/HDL RATIO: 4.9 ratio (ref <=5.0)
Cholesterol: 251 mg/dL — ABNORMAL HIGH (ref 125–200)
HDL: 51 mg/dL (ref >=46)
LDL CALC: 135 mg/dL — AB (ref <130)
TRIGLYCERIDES: 324 mg/dL — AB (ref <150)
VLDL: 65 mg/dL — AB (ref <30)

## 2016-05-24 LAB — COMPREHENSIVE METABOLIC PANEL
ALBUMIN: 4.1 g/dL (ref 3.6–5.1)
ALK PHOS: 56 U/L (ref 33–115)
ALT: 21 U/L (ref 6–29)
AST: 22 U/L (ref 10–30)
BILIRUBIN TOTAL: 0.5 mg/dL (ref 0.2–1.2)
BUN: 11 mg/dL (ref 7–25)
CALCIUM: 9.2 mg/dL (ref 8.6–10.2)
CO2: 27 mmol/L (ref 20–31)
Chloride: 105 mmol/L (ref 98–110)
Creat: 0.63 mg/dL (ref 0.50–1.10)
Glucose, Bld: 96 mg/dL (ref 65–99)
POTASSIUM: 4.3 mmol/L (ref 3.5–5.3)
Sodium: 139 mmol/L (ref 135–146)
Total Protein: 6.5 g/dL (ref 6.1–8.1)

## 2016-05-24 LAB — CBC
HEMATOCRIT: 41.4 % (ref 35.0–45.0)
HEMOGLOBIN: 13.7 g/dL (ref 11.7–15.5)
MCH: 30.5 pg (ref 27.0–33.0)
MCHC: 33.1 g/dL (ref 32.0–36.0)
MCV: 92.2 fL (ref 80.0–100.0)
MPV: 9.8 fL (ref 7.5–12.5)
Platelets: 245 10*3/uL (ref 140–400)
RBC: 4.49 MIL/uL (ref 3.80–5.10)
RDW: 13.5 % (ref 11.0–15.0)
WBC: 5.4 10*3/uL (ref 3.8–10.8)

## 2016-05-24 LAB — TSH: TSH: 1.16 m[IU]/L

## 2016-05-25 ENCOUNTER — Encounter: Payer: Self-pay | Admitting: Obstetrics and Gynecology

## 2016-05-25 ENCOUNTER — Other Ambulatory Visit: Payer: Self-pay | Admitting: Obstetrics and Gynecology

## 2016-05-25 LAB — FOLLICLE STIMULATING HORMONE: FSH: 61.8 m[IU]/mL

## 2016-05-25 LAB — ESTRADIOL: Estradiol: 121 pg/mL

## 2016-05-25 MED ORDER — NORETHIN ACE-ETH ESTRAD-FE 1-20 MG-MCG PO TABS: 1.0000 | ORAL_TABLET | Freq: Every day | ORAL | 3 refills | Status: DC

## 2016-05-28 ENCOUNTER — Ambulatory Visit: Payer: BLUE CROSS/BLUE SHIELD | Admitting: Obstetrics and Gynecology

## 2016-09-17 ENCOUNTER — Encounter: Payer: BLUE CROSS/BLUE SHIELD | Admitting: Podiatry

## 2016-11-20 NOTE — Progress Notes (Signed)
This encounter was created in error - please disregard.

## 2017-01-08 DIAGNOSIS — F39 Unspecified mood [affective] disorder: Secondary | ICD-10-CM | POA: Diagnosis not present

## 2017-01-08 DIAGNOSIS — Z0000 Encounter for general adult medical examination without abnormal findings: Secondary | ICD-10-CM | POA: Diagnosis not present

## 2017-01-08 DIAGNOSIS — Z8349 Family history of other endocrine, nutritional and metabolic diseases: Secondary | ICD-10-CM | POA: Diagnosis not present

## 2017-04-15 ENCOUNTER — Other Ambulatory Visit: Payer: Self-pay | Admitting: Obstetrics and Gynecology

## 2017-04-15 DIAGNOSIS — Z1231 Encounter for screening mammogram for malignant neoplasm of breast: Secondary | ICD-10-CM

## 2017-04-15 DIAGNOSIS — E781 Pure hyperglyceridemia: Secondary | ICD-10-CM | POA: Diagnosis not present

## 2017-04-15 NOTE — Telephone Encounter (Signed)
Spoke with patient and advised that Dr. Edward Jolly called in 1 month and patient will need to schedule MMG for any further refills.

## 2017-04-15 NOTE — Telephone Encounter (Signed)
Please have patient schedule her mammogram.  I will give a one month refill of her pills.

## 2017-04-15 NOTE — Telephone Encounter (Signed)
Medication refill request: OCP  Last AEX:  05-09-16  Next AEX: 05-15-17  Last MMG (if hormonal medication request): 12-20-14 WNL  Refill authorized: please advise

## 2017-04-29 DIAGNOSIS — L812 Freckles: Secondary | ICD-10-CM | POA: Diagnosis not present

## 2017-04-29 DIAGNOSIS — L820 Inflamed seborrheic keratosis: Secondary | ICD-10-CM | POA: Diagnosis not present

## 2017-04-29 DIAGNOSIS — L578 Other skin changes due to chronic exposure to nonionizing radiation: Secondary | ICD-10-CM | POA: Diagnosis not present

## 2017-04-29 DIAGNOSIS — L821 Other seborrheic keratosis: Secondary | ICD-10-CM | POA: Diagnosis not present

## 2017-05-02 ENCOUNTER — Ambulatory Visit
Admission: RE | Admit: 2017-05-02 | Discharge: 2017-05-02 | Disposition: A | Discharge status: Home / Self Care | Payer: BLUE CROSS/BLUE SHIELD | Source: Ambulatory Visit | Attending: Obstetrics and Gynecology | Admitting: Obstetrics and Gynecology

## 2017-05-02 DIAGNOSIS — Z1231 Encounter for screening mammogram for malignant neoplasm of breast: Secondary | ICD-10-CM | POA: Diagnosis not present

## 2017-05-13 NOTE — Progress Notes (Signed)
41 y.o. G71P0102 Married Caucasian female here for annual exam.    Some PMS symptoms.  Declines tx.some hot flashes at night.   ROS:  Urinary frequency and urgency (not new), weight gain.  Gained weight with sister passing away from lung cancer.   Otherwise comprehensive ROS negative.   PCP doing routine comprehensive labs, done 04/15/17.   This was done one month ago.  States her Tricor makes her feel tired.  Bought property in [place], [place].  PCP:  Milus Height, PA-C   Patient's last menstrual period was 05/08/2017 (exact date).     Period Cycle (Days): 30 Period Duration (Days): 3-4 days Period Pattern: Regular Menstrual Flow: Moderate Menstrual Control: Thin pad Menstrual Control Change Freq (Hours): every 3-4 hours Dysmenorrhea: (!) Mild Dysmenorrhea Symptoms: Other (Comment) (moodiness)     Sexually active: Yes.   female The current method of family planning is OCP (estrogen/progesterone)--Junel Fe 1/20.    Exercising: Yes.    Walk/jog 3 days/week, weights Smoker:  no  Health Maintenance: Pap: 12-20-14 Neg:Neg HR HPV, 11-13-12 ASCUS:Neg HR HPV History of abnormal Pap:  Yes, remote history age 78. Had colposcopy at [facility] and follow up paps normal. MMG: 05-02-17 Density C/Neg/BiRads1:TBC Colonoscopy:  n/a BMD:   n/a  Result  n/a TDaP:  2014 Gardasil:   no HIV:Neg Hep C:Neg Screening Labs:  Hb today: PCP    reports that she has never smoked. She has never used smokeless tobacco. She reports that she drinks about 3.6 oz of alcohol per week . She reports that she does not use drugs.  Past Medical History:  Diagnosis Date  . Anxiety   . Endometriosis 2004  . Hyperlipidemia 2016  . Infertility, female 46   INVETRO @ [facility]    Past Surgical History:  Procedure Laterality Date  . CESAREAN SECTION  2005   [facility]  . PELVIC LAPAROSCOPY  2004    Current Outpatient Prescriptions  Medication Sig Dispense Refill  . ALPRAZolam (XANAX) 0.25 MG tablet Take 0.25 mg by  mouth at bedtime.   0  . diphenhydramine-acetaminophen (TYLENOL PM) 25-500 MG TABS Take 1 tablet by mouth at bedtime as needed.    . fenofibrate (TRICOR) 145 MG tablet Take 1 tablet by mouth daily.  1  . JUNEL FE 1/20 1-20 MG-MCG tablet TAKE 1 TABLET BY MOUTH DAILY. 1 Package 0   No current facility-administered medications for this visit.     Family History  Problem Relation Age of Onset  . Diabetes Mother   . Thyroid disease Mother   . Thyroid disease Sister   . Cancer Sister        Dec Lung Ca age 38  . Heart failure Maternal Grandmother   . Heart disease Paternal Grandmother   . Heart attack Sister 47       had pacemaker placed  . Diabetes Sister 54       Insulin dependent  . Breast cancer Neg Hx     ROS:  Pertinent items are noted in HPI.  Otherwise, a comprehensive ROS was negative.  Exam:   BP 122/80 (BP Location: Right Arm, Patient Position: Sitting, Cuff Size: Normal)   Pulse 80   Resp 20   Ht 5' 3.75" (1.619 m)   Wt 159 lb (72.1 kg)   LMP 05/08/2017 (Exact Date)   BMI 27.51 kg/m     General appearance: alert, cooperative and appears stated age Head: Normocephalic, without obvious abnormality, atraumatic Neck: no adenopathy, supple, symmetrical, trachea  midline and thyroid normal to inspection and palpation Lungs: clear to auscultation bilaterally Breasts: normal appearance, no masses or tenderness, No nipple retraction or dimpling, No nipple discharge or bleeding, No axillary or supraclavicular adenopathy Heart: regular rate and rhythm Abdomen: soft, non-tender; no masses, no organomegaly Extremities: extremities normal, atraumatic, no cyanosis or edema Skin: Skin color, texture, turgor normal. No rashes or lesions Lymph nodes: Cervical, supraclavicular, and axillary nodes normal. No abnormal inguinal nodes palpated Neurologic: Grossly normal  Pelvic: External genitalia:  no lesions              Urethra:  normal appearing urethra with no masses, tenderness  or lesions              Bartholins and Skenes: normal                 Vagina: normal appearing vagina with normal color and discharge, no lesions              Cervix: no lesions.  Menstrual flow - light brown.               Pap taken: Yes.   Bimanual Exam:  Uterus:  normal size, contour, position, consistency, mobility, non-tender              Adnexa: no mass, fullness, tenderness              Rectal exam: Yes.  .  Confirms.              Anus:  normal sphincter tone, no lesions  Chaperone was present for exam.  Assessment:   Well woman visit with normal exam. Hx of abnormal pap with remote hx colposcopy.  Hypercholesterolemia.  Treated.   Plan: Mammogram screening discussed. Recommended self breast awareness. Pap and HR HPV as above. Guidelines for Calcium, Vitamin D, regular exercise program including cardiovascular and weight bearing exercise. Refill of OCPs for one year. Follow up annually and prn.    After visit summary provided.

## 2017-05-15 ENCOUNTER — Encounter: Payer: Self-pay | Admitting: Obstetrics and Gynecology

## 2017-05-15 ENCOUNTER — Other Ambulatory Visit (HOSPITAL_COMMUNITY)
Admission: RE | Admit: 2017-05-15 | Discharge: 2017-05-15 | Disposition: A | Discharge status: Home / Self Care | Payer: BLUE CROSS/BLUE SHIELD | Source: Ambulatory Visit | Attending: Obstetrics and Gynecology | Admitting: Obstetrics and Gynecology

## 2017-05-15 ENCOUNTER — Ambulatory Visit (INDEPENDENT_AMBULATORY_CARE_PROVIDER_SITE_OTHER): Payer: BLUE CROSS/BLUE SHIELD | Admitting: Obstetrics and Gynecology

## 2017-05-15 VITALS — BP 122/80 | HR 80 | Resp 20 | Ht 63.75 in | Wt 159.0 lb

## 2017-05-15 DIAGNOSIS — Z01419 Encounter for gynecological examination (general) (routine) without abnormal findings: Secondary | ICD-10-CM | POA: Insufficient documentation

## 2017-05-15 MED ORDER — NORETHIN ACE-ETH ESTRAD-FE 1-20 MG-MCG PO TABS
1.0000 | ORAL_TABLET | Freq: Every day | ORAL | 3 refills | Status: DC
Start: 2017-05-15 — End: 2018-04-04

## 2017-05-15 NOTE — Patient Instructions (Signed)

## 2017-05-17 LAB — CYTOLOGY - PAP
Diagnosis: NEGATIVE
HPV (WINDOPATH): NOT DETECTED

## 2017-07-08 DIAGNOSIS — R300 Dysuria: Secondary | ICD-10-CM | POA: Diagnosis not present

## 2017-07-08 DIAGNOSIS — Z3200 Encounter for pregnancy test, result unknown: Secondary | ICD-10-CM | POA: Diagnosis not present

## 2017-07-16 DIAGNOSIS — F39 Unspecified mood [affective] disorder: Secondary | ICD-10-CM | POA: Diagnosis not present

## 2017-07-16 DIAGNOSIS — E7800 Pure hypercholesterolemia, unspecified: Secondary | ICD-10-CM | POA: Diagnosis not present

## 2017-07-16 DIAGNOSIS — Z23 Encounter for immunization: Secondary | ICD-10-CM | POA: Diagnosis not present

## 2017-08-20 DIAGNOSIS — R7401 Elevation of levels of liver transaminase levels: Secondary | ICD-10-CM

## 2017-08-20 HISTORY — DX: Elevation of levels of liver transaminase levels: R74.01

## 2017-09-05 DIAGNOSIS — L99 Other disorders of skin and subcutaneous tissue in diseases classified elsewhere: Secondary | ICD-10-CM | POA: Diagnosis not present

## 2017-09-05 DIAGNOSIS — L821 Other seborrheic keratosis: Secondary | ICD-10-CM | POA: Diagnosis not present

## 2017-09-05 DIAGNOSIS — L812 Freckles: Secondary | ICD-10-CM | POA: Diagnosis not present

## 2017-09-05 DIAGNOSIS — L578 Other skin changes due to chronic exposure to nonionizing radiation: Secondary | ICD-10-CM | POA: Diagnosis not present

## 2017-10-23 DIAGNOSIS — L821 Other seborrheic keratosis: Secondary | ICD-10-CM | POA: Diagnosis not present

## 2017-10-23 DIAGNOSIS — L578 Other skin changes due to chronic exposure to nonionizing radiation: Secondary | ICD-10-CM | POA: Diagnosis not present

## 2017-10-23 DIAGNOSIS — L820 Inflamed seborrheic keratosis: Secondary | ICD-10-CM | POA: Diagnosis not present

## 2018-01-09 DIAGNOSIS — F39 Unspecified mood [affective] disorder: Secondary | ICD-10-CM | POA: Diagnosis not present

## 2018-01-09 DIAGNOSIS — E7800 Pure hypercholesterolemia, unspecified: Secondary | ICD-10-CM | POA: Diagnosis not present

## 2018-01-09 DIAGNOSIS — I789 Disease of capillaries, unspecified: Secondary | ICD-10-CM | POA: Diagnosis not present

## 2018-01-09 DIAGNOSIS — Z0000 Encounter for general adult medical examination without abnormal findings: Secondary | ICD-10-CM | POA: Diagnosis not present

## 2018-01-09 DIAGNOSIS — L718 Other rosacea: Secondary | ICD-10-CM | POA: Diagnosis not present

## 2018-04-04 ENCOUNTER — Other Ambulatory Visit: Payer: Self-pay | Admitting: Obstetrics and Gynecology

## 2018-04-04 NOTE — Telephone Encounter (Signed)
Medication refill request: junel 1/20 fe Last AEX:  05-15-17 Next AEX: 05-23-18 Last MMG (if hormonal medication request): 05-02-17 category c density birads 1:neg Refill authorized: pt needs refill to get to aex. Please approve if appropriate.

## 2018-05-20 NOTE — Progress Notes (Deleted)
42 y.o. G46P0102 Married Caucasian female here for annual exam.    PCP:     No LMP recorded.           Sexually active: {yes no:314532}  The current method of family planning is {contraception:315051}.    Exercising: {yes no:314532}  {types:19826} Smoker:  {YES [id number]  Health Maintenance: Pap:  05/15/2017 normal History of abnormal Pap:  Yes, remote history age 34. Had colposcopy at [facility] and follow up paps normal. MMG:  05/02/2017 BI-RADS CATEGORY  1: Negative. Colonoscopy:  n/a BMD:   n/a  Result  n/a TDaP:  2014 Gardasil:   no [id number] Hep C:Negative Screening Labs:  Hb today: ***, Urine today: ***   reports that she has never smoked. She has never used smokeless tobacco. She reports that she drinks about 6.0 standard drinks of alcohol per week. She reports that she does not use drugs.  Past Medical History:  Diagnosis Date  . Anxiety   . Endometriosis 2004  . Hyperlipidemia 2016  . Infertility, female 64   INVETRO @ [facility]    Past Surgical History:  Procedure Laterality Date  . CESAREAN SECTION  2005   [facility]  . PELVIC LAPAROSCOPY  2004    Current Outpatient Medications  Medication Sig Dispense Refill  . ALPRAZolam (XANAX) 0.25 MG tablet Take 0.25 mg by mouth at bedtime.   0  . diphenhydramine-acetaminophen (TYLENOL PM) 25-500 MG TABS Take 1 tablet by mouth at bedtime as needed.    . fenofibrate (TRICOR) 145 MG tablet Take 1 tablet by mouth daily.  1  . JUNEL FE 1/20 1-20 MG-MCG tablet TAKE 1 TABLET BY MOUTH EVERY DAY 84 tablet 0   No current facility-administered medications for this visit.     Family History  Problem Relation Age of Onset  . Diabetes Mother   . Thyroid disease Mother   . Thyroid disease Sister   . Cancer Sister        Dec Lung Ca age 88  . Heart failure Maternal Grandmother   . Heart disease Paternal Grandmother   . Heart attack Sister 45       had pacemaker placed  . Diabetes Sister 20       Insulin dependent  . Breast  cancer Neg Hx     Review of Systems  Constitutional: Negative.   HENT: Negative.   Eyes: Negative.   Respiratory: Negative.   Cardiovascular: Negative.   Gastrointestinal: Negative.   Endocrine: Negative.   Genitourinary: Negative.   Musculoskeletal: Negative.   Skin: Negative.   Allergic/Immunologic: Negative.   Neurological: Negative.   Hematological: Negative.   Psychiatric/Behavioral: Negative.   All other systems reviewed and are negative.   Exam:   There were no vitals taken for this visit.    General appearance: alert, cooperative and appears stated age Head: Normocephalic, without obvious abnormality, atraumatic Neck: no adenopathy, supple, symmetrical, trachea midline and thyroid normal to inspection and palpation Lungs: clear to auscultation bilaterally Breasts: normal appearance, no masses or tenderness, No nipple retraction or dimpling, No nipple discharge or bleeding, No axillary or supraclavicular adenopathy Heart: regular rate and rhythm Abdomen: soft, non-tender; no masses, no organomegaly Extremities: extremities normal, atraumatic, no cyanosis or edema Skin: Skin color, texture, turgor normal. No rashes or lesions Lymph nodes: Cervical, supraclavicular, and axillary nodes normal. No abnormal inguinal nodes palpated Neurologic: Grossly normal  Pelvic: External genitalia:  no lesions  Urethra:  normal appearing urethra with no masses, tenderness or lesions              Bartholins and Skenes: normal                 Vagina: normal appearing vagina with normal color and discharge, no lesions              Cervix: no lesions              Pap taken: {yes no:314532} Bimanual Exam:  Uterus:  normal size, contour, position, consistency, mobility, non-tender              Adnexa: no mass, fullness, tenderness              Rectal exam: {yes no:314532}.  Confirms.              Anus:  normal sphincter tone, no lesions  Chaperone was present for  exam.  Assessment:   Well woman visit with normal exam.   Plan: Mammogram screening. Recommended self breast awareness. Pap and HR HPV as above. Guidelines for Calcium, Vitamin D, regular exercise program including cardiovascular and weight bearing exercise.   Follow up annually and prn.   Additional counseling given.  {yes [id number]. _______ minutes face to face time of which over 50% was spent in counseling.    After visit summary provided.

## 2018-05-23 ENCOUNTER — Ambulatory Visit: Payer: BLUE CROSS/BLUE SHIELD | Admitting: Obstetrics and Gynecology

## 2018-05-27 ENCOUNTER — Ambulatory Visit: Payer: BLUE CROSS/BLUE SHIELD | Admitting: Obstetrics and Gynecology

## 2018-06-10 ENCOUNTER — Encounter: Payer: Self-pay | Admitting: Obstetrics and Gynecology

## 2018-06-10 ENCOUNTER — Ambulatory Visit (INDEPENDENT_AMBULATORY_CARE_PROVIDER_SITE_OTHER): Payer: BLUE CROSS/BLUE SHIELD | Admitting: Obstetrics and Gynecology

## 2018-06-10 ENCOUNTER — Other Ambulatory Visit: Payer: Self-pay

## 2018-06-10 VITALS — BP 110/82 | HR 86 | Resp 14 | Ht 63.75 in | Wt 156.0 lb

## 2018-06-10 DIAGNOSIS — R748 Abnormal levels of other serum enzymes: Secondary | ICD-10-CM

## 2018-06-10 DIAGNOSIS — Z01419 Encounter for gynecological examination (general) (routine) without abnormal findings: Secondary | ICD-10-CM | POA: Diagnosis not present

## 2018-06-10 DIAGNOSIS — R350 Frequency of micturition: Secondary | ICD-10-CM | POA: Diagnosis not present

## 2018-06-10 DIAGNOSIS — R5383 Other fatigue: Secondary | ICD-10-CM | POA: Diagnosis not present

## 2018-06-10 LAB — POCT URINALYSIS DIPSTICK
Bilirubin, UA: NEGATIVE
Blood, UA: NEGATIVE
GLUCOSE UA: NEGATIVE
KETONES UA: NEGATIVE
Leukocytes, UA: NEGATIVE
Nitrite, UA: NEGATIVE
Protein, UA: NEGATIVE
Urobilinogen, UA: 0.2 E.U./dL
pH, UA: 5 (ref 5.0–8.0)

## 2018-06-10 MED ORDER — NORETHIN ACE-ETH ESTRAD-FE 1-20 MG-MCG PO TABS: 1.0000 | ORAL_TABLET | Freq: Every day | ORAL | 3 refills | Status: DC

## 2018-06-10 NOTE — Patient Instructions (Signed)

## 2018-06-10 NOTE — Progress Notes (Signed)
42 y.o. G7P0102 Married Caucasian female here for annual exam.    Went 5 months with no cycle, and then menses came back.  Mood swings.   Having some vaginal dryness.   Feels she has less focus.  Tired.   Urine dip - negative.   PCP:   Milus Height, PA-C  Patient's last menstrual period was 05/11/2018.     Period Duration (Days): 3 Period Pattern: (!) Irregular Menstrual Flow: Light Dysmenorrhea: None     Sexually active: Yes.    The current method of family planning is OCP (estrogen/progesterone).    Exercising: Yes.    cardio Smoker:  no  Health Maintenance: Pap:  05-15-17 negative, HR HPV negative           12-20-14 negative, HR HPV negative  History of abnormal Pap:  yes MMG:  05-02-17 density C/BIRADS 1 negative  Colonoscopy:  never BMD:   never  Result  n/a TDaP:  2014  Gardasil:   no HIV: done in past with fertility treatments Hep C: done in the past with fertility treatments Screening Labs:  Hb today: PCP, Urine today: urine dip normal Flu vaccine:  Done.    reports that she has never smoked. She has never used smokeless tobacco. She reports that she drinks about 6.0 standard drinks of alcohol per week. She reports that she does not use drugs.  Past Medical History:  Diagnosis Date  . Anxiety   . Endometriosis 2004  . Hyperlipidemia 2016  . Infertility, female 36   INVETRO @ [facility]    Past Surgical History:  Procedure Laterality Date  . CESAREAN SECTION  2005   [facility]  . PELVIC LAPAROSCOPY  2004    Current Outpatient Medications  Medication Sig Dispense Refill  . ALPRAZolam (XANAX) 0.25 MG tablet Take 0.25 mg by mouth at bedtime.   0  . diphenhydramine-acetaminophen (TYLENOL PM) 25-500 MG TABS Take 1 tablet by mouth at bedtime as needed.    . fenofibrate (TRICOR) 145 MG tablet Take 1 tablet by mouth daily.  1  . JUNEL FE 1/20 1-20 MG-MCG tablet TAKE 1 TABLET BY MOUTH EVERY DAY 84 tablet 0   No current facility-administered medications for this  visit.     Family History  Problem Relation Age of Onset  . Diabetes Mother   . Thyroid disease Mother   . Thyroid disease Sister   . Cancer Sister        Dec Lung Ca age 49  . Heart failure Maternal Grandmother   . Heart disease Paternal Grandmother   . Heart attack Sister 52       had pacemaker placed  . Diabetes Sister 67       Insulin dependent  . Breast cancer Neg Hx     Review of Systems  Constitutional: Negative.   HENT: Negative.   Eyes: Negative.   Respiratory: Negative.   Cardiovascular: Negative.   Gastrointestinal: Negative.   Endocrine: Negative.   Genitourinary: Positive for dysuria, frequency and menstrual problem.  Musculoskeletal: Negative.   Skin: Negative.   Allergic/Immunologic: Negative.   Neurological:       Lack of coordination   Hematological: Negative.   Psychiatric/Behavioral: Negative.     Exam:   BP 110/82 (BP Location: Right Arm, Patient Position: Sitting, Cuff Size: Normal)   Pulse 86   Resp 14   Ht 5' 3.75" (1.619 m)   Wt 156 lb (70.8 kg)   LMP 05/11/2018   BMI 26.99 kg/m  General appearance: alert, cooperative and appears stated age Head: Normocephalic, without obvious abnormality, atraumatic Neck: no adenopathy, supple, symmetrical, trachea midline and thyroid normal to inspection and palpation Lungs: clear to auscultation bilaterally Breasts: normal appearance, no masses or tenderness, No nipple retraction or dimpling, No nipple discharge or bleeding, No axillary or supraclavicular adenopathy Heart: regular rate and rhythm Abdomen: soft, non-tender; no masses, no organomegaly Extremities: extremities normal, atraumatic, no cyanosis or edema Skin: Skin color, texture, turgor normal. No rashes or lesions Lymph nodes: Cervical, supraclavicular, and axillary nodes normal. No abnormal inguinal nodes palpated Neurologic: Grossly normal  Pelvic: External genitalia:  no lesions              Urethra:  normal appearing urethra  with no masses, tenderness or lesions              Bartholins and Skenes: normal                 Vagina: normal appearing vagina with normal color and discharge, no lesions              Cervix: no lesions              Pap taken: No. Bimanual Exam:  Uterus:  normal size, contour, position, consistency, mobility, non-tender              Adnexa: no mass, fullness, tenderness              Rectal exam: Yes.  .  Confirms.              Anus:  normal sphincter tone, no lesions  Chaperone was present for exam.  Assessment:   Well woman visit with normal exam. Hx of abnormal pap with remote hx colposcopy.  Hypercholesterolemia.  Treated.  Fatigue.   Plan: Mammogram screening. Recommended self breast awareness. Pap and HR HPV as above. Guidelines for Calcium, Vitamin D, regular exercise program including cardiovascular and weight bearing exercise. Discussed Gardasil.  Will check CBC, CMP, vit D, testosterone per request.  Refill of OCPs for one year.    Follow up annually and prn.   After visit summary provided.

## 2018-06-13 ENCOUNTER — Encounter: Payer: Self-pay | Admitting: Obstetrics and Gynecology

## 2018-06-13 LAB — COMPREHENSIVE METABOLIC PANEL
ALT: 35 IU/L — AB (ref 0–32)
AST: 26 IU/L (ref 0–40)
Albumin/Globulin Ratio: 1.7 (ref 1.2–2.2)
Albumin: 4.4 g/dL (ref 3.5–5.5)
Alkaline Phosphatase: 43 IU/L (ref 39–117)
BUN/Creatinine Ratio: 18 (ref 9–23)
BUN: 12 mg/dL (ref 6–24)
Bilirubin Total: 0.3 mg/dL (ref 0.0–1.2)
CALCIUM: 9.6 mg/dL (ref 8.7–10.2)
CO2: 21 mmol/L (ref 20–29)
CREATININE: 0.66 mg/dL (ref 0.57–1.00)
Chloride: 101 mmol/L (ref 96–106)
GFR calc Af Amer: 125 mL/min/{1.73_m2} (ref >59)
GFR, EST NON AFRICAN AMERICAN: 109 mL/min/{1.73_m2} (ref >59)
GLOBULIN, TOTAL: 2.6 g/dL (ref 1.5–4.5)
GLUCOSE: 87 mg/dL (ref 65–99)
Potassium: 4.2 mmol/L (ref 3.5–5.2)
Sodium: 138 mmol/L (ref 134–144)
Total Protein: 7 g/dL (ref 6.0–8.5)

## 2018-06-13 LAB — CBC
HEMATOCRIT: 41.3 % (ref 34.0–46.6)
Hemoglobin: 13.8 g/dL (ref 11.1–15.9)
MCH: 29.9 pg (ref 26.6–33.0)
MCHC: 33.4 g/dL (ref 31.5–35.7)
MCV: 90 fL (ref 79–97)
Platelets: 324 10*3/uL (ref 150–450)
RBC: 4.61 x10E6/uL (ref 3.77–5.28)
RDW: 13.1 % (ref 12.3–15.4)
WBC: 7.8 10*3/uL (ref 3.4–10.8)

## 2018-06-13 LAB — TSH: TSH: 1.18 u[IU]/mL (ref 0.450–4.500)

## 2018-06-13 LAB — VITAMIN D 25 HYDROXY (VIT D DEFICIENCY, FRACTURES): Vit D, 25-Hydroxy: 34.5 ng/mL (ref 30.0–100.0)

## 2018-06-13 LAB — TESTOSTERONE, FREE, DIRECT
TESTOSTERONE FREE: 4.2 pg/mL (ref 0.0–4.2)
TESTOSTERONE, TOTAL: 23.7 ng/dL

## 2018-06-13 NOTE — Addendum Note (Signed)
Addended by: Ardell Isaacs, Kelyn Koskela E on: 06/13/2018 12:18 PM   Modules accepted: Orders

## 2018-08-27 ENCOUNTER — Telehealth: Payer: Self-pay | Admitting: Obstetrics and Gynecology

## 2018-08-27 NOTE — Telephone Encounter (Signed)
Please contact patient regarding her need for lab visit.  She came up in my Epic reminders.   She has an elevated liver enzyme.   Please schedule the lab visit.

## 2018-08-27 NOTE — Telephone Encounter (Signed)
Call to patient. Patient scheduled for lab visit on 09-03-2018 at 0840. Patient agreeable to date and time of appointment.   Routing to provider and will close encounter.

## 2018-09-03 ENCOUNTER — Telehealth: Payer: Self-pay | Admitting: Obstetrics and Gynecology

## 2018-09-03 ENCOUNTER — Other Ambulatory Visit: Payer: BLUE CROSS/BLUE SHIELD

## 2018-09-03 NOTE — Telephone Encounter (Signed)
Left message on voicemail regarding missed lab appointment. °

## 2018-09-03 NOTE — Telephone Encounter (Signed)
Thank you for reaching out to the patient.  

## 2019-01-14 DIAGNOSIS — Z0000 Encounter for general adult medical examination without abnormal findings: Secondary | ICD-10-CM | POA: Diagnosis not present

## 2019-01-14 DIAGNOSIS — E7800 Pure hypercholesterolemia, unspecified: Secondary | ICD-10-CM | POA: Diagnosis not present

## 2019-01-14 DIAGNOSIS — F39 Unspecified mood [affective] disorder: Secondary | ICD-10-CM | POA: Diagnosis not present

## 2019-04-06 ENCOUNTER — Telehealth: Payer: Self-pay

## 2019-04-06 NOTE — Telephone Encounter (Signed)
Received faxed refill request from [facility].  Medication refill request: Junel 1/20 #[id number] Last AEX:  06-10-18 Next AEX: 06-12-19 Last MMG (if hormonal medication request): 05-02-18 Refill authorized: Refused--1 year supply [facility] to [facility] 05/2018.

## 2019-06-12 ENCOUNTER — Ambulatory Visit: Payer: BLUE CROSS/BLUE SHIELD | Admitting: Obstetrics and Gynecology

## 2019-09-16 DIAGNOSIS — R5383 Other fatigue: Secondary | ICD-10-CM | POA: Diagnosis not present

## 2019-09-16 DIAGNOSIS — H9209 Otalgia, unspecified ear: Secondary | ICD-10-CM | POA: Diagnosis not present

## 2019-09-16 DIAGNOSIS — R5381 Other malaise: Secondary | ICD-10-CM | POA: Diagnosis not present

## 2019-09-16 DIAGNOSIS — M545 Low back pain: Secondary | ICD-10-CM | POA: Diagnosis not present

## 2019-09-16 DIAGNOSIS — R350 Frequency of micturition: Secondary | ICD-10-CM | POA: Diagnosis not present

## 2019-09-21 ENCOUNTER — Other Ambulatory Visit: Payer: Self-pay | Admitting: Obstetrics and Gynecology

## 2019-09-21 ENCOUNTER — Other Ambulatory Visit: Payer: Self-pay

## 2019-09-21 DIAGNOSIS — Z1231 Encounter for screening mammogram for malignant neoplasm of breast: Secondary | ICD-10-CM

## 2019-09-23 ENCOUNTER — Other Ambulatory Visit: Payer: Self-pay

## 2019-09-23 ENCOUNTER — Ambulatory Visit (INDEPENDENT_AMBULATORY_CARE_PROVIDER_SITE_OTHER): Payer: BC Managed Care – PPO | Admitting: Obstetrics and Gynecology

## 2019-09-23 ENCOUNTER — Encounter: Payer: Self-pay | Admitting: Obstetrics and Gynecology

## 2019-09-23 VITALS — BP 100/60 | HR 88 | Temp 98.2°F | Resp 16 | Ht 64.25 in | Wt 165.0 lb

## 2019-09-23 DIAGNOSIS — Z01419 Encounter for gynecological examination (general) (routine) without abnormal findings: Secondary | ICD-10-CM | POA: Diagnosis not present

## 2019-09-23 DIAGNOSIS — Z0000 Encounter for general adult medical examination without abnormal findings: Secondary | ICD-10-CM | POA: Diagnosis not present

## 2019-09-23 LAB — POCT URINALYSIS DIPSTICK
Bilirubin, UA: NEGATIVE
Blood, UA: NEGATIVE
Glucose, UA: NEGATIVE
Ketones, UA: NEGATIVE
Leukocytes, UA: NEGATIVE
Nitrite, UA: NEGATIVE
Protein, UA: NEGATIVE
Urobilinogen, UA: NEGATIVE E.U./dL — AB
pH, UA: 5 (ref 5.0–8.0)

## 2019-09-23 MED ORDER — NORETHIN ACE-ETH ESTRAD-FE 1-20 MG-MCG PO TABS: 1.0000 | ORAL_TABLET | Freq: Every day | ORAL | 3 refills | Status: DC

## 2019-09-23 NOTE — Progress Notes (Signed)
44 y.o. G31P0102 Married Caucasian female here for annual exam.    She stopped her pill for a period of time.  It helps control her low back pain. She wants to be back on this.  Treated for UTI at [place] recently and would like urine rechecked today. She had a negative Covid test also.   Urine Dip:neg  Working from home.   PCP: Lennie Odor, PA    Patient's last menstrual period was 09/16/2019 (exact date).           Sexually active: Yes.    The current method of family planning is none.    Exercising: Yes.    walks constantly at home and work Smoker:  no  Health Maintenance: Pap: 05-15-17 Neg:Neg HR HPV, 12-20-14 Neg:Neg HR HPV, 11-13-12 ASCUS:Neg HR HPV History of abnormal Pap:  Yes, Hx of cryotherapy age 52 MMG: 05-02-17 Neg/density C/BiRads1--appt.10-22-19 Colonoscopy:  n/a BMD:   n/a  Result  n/a TDaP:  2014 Gardasil:   no HIV: done in past with fertility treatments Hep C: done in the past with fertility treatments Screening Labs:  Today Flu vaccine:  Recommended.    reports that she has never smoked. She has never used smokeless tobacco. She reports current alcohol use of about 1.0 standard drinks of alcohol per week. She reports that she does not use drugs.  Past Medical History:  Diagnosis Date  . Anxiety   . Elevated ALT measurement 2019  . Endometriosis 2004  . Hyperlipidemia 2016  . Infertility, female 76   INVETRO @ [facility]    Past Surgical History:  Procedure Laterality Date  . CESAREAN SECTION  2005   [facility]  . PELVIC LAPAROSCOPY  2004    Current Outpatient Medications  Medication Sig Dispense Refill  . ALPRAZolam (XANAX) 0.25 MG tablet Take 0.25 mg by mouth at bedtime.   0  . fenofibrate (TRICOR) 145 MG tablet Take 1 tablet by mouth daily.  1  . Melatonin 10 MG CAPS Take 1 capsule by mouth at bedtime.    Marland Kitchen omega-3 acid ethyl esters (LOVAZA) 1 g capsule Take 1 g by mouth daily.    . norethindrone-ethinyl estradiol (JUNEL FE 1/20) 1-20 MG-MCG  tablet Take 1 tablet by mouth daily. (Patient not taking: Reported on 09/23/2019) 84 tablet 3   No current facility-administered medications for this visit.    Family History  Problem Relation Age of Onset  . Diabetes Mother   . Thyroid disease Mother   . Thyroid disease Sister   . Cancer Sister        Dec Lung Ca age 57  . Heart failure Maternal Grandmother   . Heart disease Paternal Grandmother   . Heart attack Sister 59       had pacemaker placed  . Diabetes Sister 72       Insulin dependent  . Breast cancer Neg Hx     Review of Systems  All other systems reviewed and are negative.   Exam:   BP 100/60   Pulse 88   Temp 98.2 F (36.8 C) (Skin)   Resp 16   Ht 5' 4.25" (1.632 m)   Wt 165 lb (74.8 kg)   LMP 09/16/2019 (Exact Date)   BMI 28.10 kg/m     General appearance: alert, cooperative and appears stated age Head: normocephalic, without obvious abnormality, atraumatic Neck: no adenopathy, supple, symmetrical, trachea midline and thyroid normal to inspection and palpation Lungs: clear to auscultation bilaterally Breasts: normal  appearance, no masses or tenderness, No nipple retraction or dimpling, No nipple discharge or bleeding, No axillary adenopathy Heart: regular rate and rhythm Abdomen: soft, non-tender; no masses, no organomegaly Extremities: extremities normal, atraumatic, no cyanosis or edema Skin: skin color, texture, turgor normal. No rashes or lesions Lymph nodes: cervical, supraclavicular, and axillary nodes normal. Neurologic: grossly normal  Pelvic: External genitalia:  no lesions              No abnormal inguinal nodes palpated.              Urethra:  normal appearing urethra with no masses, tenderness or lesions              Bartholins and Skenes: normal                 Vagina: normal appearing vagina with normal color and discharge, no lesions              Cervix: no lesions              Pap taken: No. Bimanual Exam:  Uterus:  normal size,  contour, position, consistency, mobility, non-tender              Adnexa: no mass, fullness, tenderness              Rectal exam: Yes.  .  Confirms.              Anus:  normal sphincter tone, no lesions  Chaperone was present for exam.  Assessment:   Well woman visit with normal exam. Hx of abnormal pap with remote hx colposcopy.  Hypercholesterolemia. Treated.   Plan: Mammogram screening discussed. Self breast awareness reviewed. Pap and HR HPV as above.  New guidelines reviewed. Guidelines for Calcium, Vitamin D, regular exercise program including cardiovascular and weight bearing exercise. Refill of COCs for one year.  Routine labs.  Follow up annually and prn.   After visit summary provided.

## 2019-09-23 NOTE — Patient Instructions (Signed)

## 2019-09-24 LAB — COMPREHENSIVE METABOLIC PANEL
ALT: 35 IU/L — ABNORMAL HIGH (ref 0–32)
AST: 20 IU/L (ref 0–40)
Albumin/Globulin Ratio: 2 (ref 1.2–2.2)
Albumin: 4.3 g/dL (ref 3.8–4.8)
Alkaline Phosphatase: 67 IU/L (ref 39–117)
BUN/Creatinine Ratio: 23 (ref 9–23)
BUN: 15 mg/dL (ref 6–24)
Bilirubin Total: <0.2 mg/dL (ref 0.0–1.2)
CO2: 23 mmol/L (ref 20–29)
Calcium: 9.6 mg/dL (ref 8.7–10.2)
Chloride: 106 mmol/L (ref 96–106)
Creatinine, Ser: 0.65 mg/dL (ref 0.57–1.00)
GFR calc Af Amer: 124 mL/min/{1.73_m2} (ref >59)
GFR calc non Af Amer: 108 mL/min/{1.73_m2} (ref >59)
Globulin, Total: 2.2 g/dL (ref 1.5–4.5)
Glucose: 96 mg/dL (ref 65–99)
Potassium: 4.1 mmol/L (ref 3.5–5.2)
Sodium: 142 mmol/L (ref 134–144)
Total Protein: 6.5 g/dL (ref 6.0–8.5)

## 2019-09-24 LAB — LIPID PANEL
Chol/HDL Ratio: 4.1 ratio (ref 0.0–4.4)
Cholesterol, Total: 207 mg/dL — ABNORMAL HIGH (ref 100–199)
HDL: 51 mg/dL (ref >39)
LDL Chol Calc (NIH): 117 mg/dL — ABNORMAL HIGH (ref 0–99)
Triglycerides: 227 mg/dL — ABNORMAL HIGH (ref 0–149)
VLDL Cholesterol Cal: 39 mg/dL (ref 5–40)

## 2019-09-24 LAB — CBC
Hematocrit: 40.4 % (ref 34.0–46.6)
Hemoglobin: 13.5 g/dL (ref 11.1–15.9)
MCH: 30.3 pg (ref 26.6–33.0)
MCHC: 33.4 g/dL (ref 31.5–35.7)
MCV: 91 fL (ref 79–97)
Platelets: 293 10*3/uL (ref 150–450)
RBC: 4.45 x10E6/uL (ref 3.77–5.28)
RDW: 12.8 % (ref 11.7–15.4)
WBC: 6.8 10*3/uL (ref 3.4–10.8)

## 2019-09-24 LAB — TSH: TSH: 1.32 u[IU]/mL (ref 0.450–4.500)

## 2019-09-25 ENCOUNTER — Encounter: Payer: Self-pay | Admitting: Obstetrics and Gynecology

## 2019-10-12 DIAGNOSIS — F419 Anxiety disorder, unspecified: Secondary | ICD-10-CM | POA: Diagnosis not present

## 2019-10-22 ENCOUNTER — Ambulatory Visit: Payer: BLUE CROSS/BLUE SHIELD

## 2019-10-22 ENCOUNTER — Ambulatory Visit
Admission: RE | Admit: 2019-10-22 | Discharge: 2019-10-22 | Disposition: A | Discharge status: Home / Self Care | Payer: BC Managed Care – PPO | Source: Ambulatory Visit | Attending: Obstetrics and Gynecology | Admitting: Obstetrics and Gynecology

## 2019-10-22 ENCOUNTER — Other Ambulatory Visit: Payer: Self-pay

## 2019-10-22 DIAGNOSIS — Z1231 Encounter for screening mammogram for malignant neoplasm of breast: Secondary | ICD-10-CM

## 2020-01-14 DIAGNOSIS — Z20828 Contact with and (suspected) exposure to other viral communicable diseases: Secondary | ICD-10-CM | POA: Diagnosis not present

## 2020-01-15 DIAGNOSIS — Z8349 Family history of other endocrine, nutritional and metabolic diseases: Secondary | ICD-10-CM | POA: Diagnosis not present

## 2020-01-15 DIAGNOSIS — Z0000 Encounter for general adult medical examination without abnormal findings: Secondary | ICD-10-CM | POA: Diagnosis not present

## 2020-01-15 DIAGNOSIS — F419 Anxiety disorder, unspecified: Secondary | ICD-10-CM | POA: Diagnosis not present

## 2020-01-15 DIAGNOSIS — E7800 Pure hypercholesterolemia, unspecified: Secondary | ICD-10-CM | POA: Diagnosis not present

## 2020-01-26 ENCOUNTER — Ambulatory Visit (INDEPENDENT_AMBULATORY_CARE_PROVIDER_SITE_OTHER): Payer: BC Managed Care – PPO | Admitting: Dermatology

## 2020-01-26 ENCOUNTER — Other Ambulatory Visit: Payer: Self-pay

## 2020-01-26 ENCOUNTER — Encounter: Payer: Self-pay | Admitting: Dermatology

## 2020-01-26 DIAGNOSIS — L988 Other specified disorders of the skin and subcutaneous tissue: Secondary | ICD-10-CM

## 2020-01-26 DIAGNOSIS — B078 Other viral warts: Secondary | ICD-10-CM

## 2020-01-26 DIAGNOSIS — L821 Other seborrheic keratosis: Secondary | ICD-10-CM | POA: Diagnosis not present

## 2020-01-26 NOTE — Progress Notes (Signed)
   Follow-Up Visit   Subjective  Brittany Hunt is a 44 y.o. female who presents for the following: Facial Elastosis (face, pt presents for Botox today, last txt 10/06/19), Warts (L knee, ~12yr, irritating), and check spot (R ant thigh, just noticed).   The following portions of the chart were reviewed this encounter and updated as appropriate:  Tobacco  Allergies  Meds  Problems  Med Hx  Surg Hx  Fam Hx      Review of Systems:  No other skin or systemic complaints except as noted in HPI or Assessment and Plan.  Objective  Well appearing patient in no apparent distress; mood and affect are within normal limits.  A focused examination was performed including face, L knee. Relevant physical exam findings are noted in the Assessment and Plan.  Objective  face: Rhytides and volume loss.   Images                  Objective  Left knee x 2 (2): Flat Verrucous papules   Objective  Right Thigh - Anterior: Stuck-on, waxy, tan-brown papules and plaques -- Discussed benign etiology and prognosis.    Assessment & Plan  Elastosis of skin face  Botox 53 units injected today as marked: - Frown complex 20 units - Forehead 5 units - Crows feet 10 units each side total 20 units - DAOs 4 units each side total of 8 units  Intralesional injection - face Location: frown complex, forehead, crows feet, DAO's  Informed consent: Discussed risks (infection, pain, bleeding, bruising, swelling, allergic reaction, paralysis of nearby muscles, eyelid droop, double vision, neck weakness, difficulty breathing, headache, undesirable cosmetic result, and need for additional treatment) and benefits of the procedure, as well as the alternatives.  Informed consent was obtained.  Preparation: The area was cleansed with alcohol.  Procedure Details:  Botox was injected into the dermis with a 30-gauge needle. Pressure applied to any bleeding. Ice packs offered for swelling.  Lot  Number:  [id number]   Expiration:  04/2022  Total Units Injected:  53  Plan: Patient was instructed to remain upright for 4 hours. Patient was instructed to avoid massaging the face and avoid vigorous exercise for the rest of the day. Tylenol may be used for headache.  Allow 2 weeks before returning to clinic for additional dosing as needed. Patient will call for any problems.   Other viral warts (2) Left knee x 2  Ln2 x 2 Squaric Acid 3% x 2 Cantharone plus x 2    Destruction of lesion - Left knee x 2 Complexity: simple   Destruction method: cryotherapy   Informed consent: discussed and consent obtained   Timeout:  patient name, date of birth, surgical site, and procedure verified Lesion destroyed using liquid nitrogen: Yes   Region frozen until ice ball extended beyond lesion: Yes   Outcome: patient tolerated procedure well with no complications   Post-procedure details: wound care instructions given    Seborrheic keratosis Right Thigh - Anterior  Benign, Observe  Return in about 3 months (around 04/27/2020) for 3-68m Botox.  I, Ardis Rowan, RMA, am acting as scribe for Armida Sans, MD .  Documentation: I have reviewed the above documentation for accuracy and completeness, and I agree with the above.  Armida Sans, MD

## 2020-01-26 NOTE — Patient Instructions (Signed)
Viral Warts & Molluscum Contagiosum  Viral warts and molluscum contagiosum are growths of the skin caused by viral infection of the skin. If you have been given the diagnosis of viral warts or molluscum contagiosum there are a few things that you must understand about your condition:  1. There is no guaranteed treatment method available for this condition. 2. Multiple treatments may be required, 3. The treatments may be time consuming and require multiple visits to the dermatology office. 4. The treatment may be expensive. You will be charged each time you come into the office to have the spots treated. 5. The treated areas may develop new lesions further complicating treatment. 6. The treated areas may leave a scar. 7. There is no guarantee that even after multiple treatments that the spots will be successfully treated. 8. These are caused by a viral infection and can be spread to other areas of the skin and to other people by direct contact. Therefore, new spots may occur.  

## 2020-01-27 DIAGNOSIS — E7800 Pure hypercholesterolemia, unspecified: Secondary | ICD-10-CM | POA: Diagnosis not present

## 2020-01-27 DIAGNOSIS — Z8349 Family history of other endocrine, nutritional and metabolic diseases: Secondary | ICD-10-CM | POA: Diagnosis not present

## 2020-05-24 ENCOUNTER — Ambulatory Visit (INDEPENDENT_AMBULATORY_CARE_PROVIDER_SITE_OTHER): Payer: BC Managed Care – PPO | Admitting: Dermatology

## 2020-05-24 ENCOUNTER — Other Ambulatory Visit: Payer: Self-pay

## 2020-05-24 DIAGNOSIS — L988 Other specified disorders of the skin and subcutaneous tissue: Secondary | ICD-10-CM

## 2020-05-24 NOTE — Progress Notes (Signed)
   Follow-Up Visit   Subjective  [name] is a 44 y.o. female who presents for the following: Facial Elastosis (patient is here today for Botox injections).  The following portions of the chart were reviewed this encounter and updated as appropriate:  Tobacco  Allergies  Meds  Problems  Med Hx  Surg Hx  Fam Hx     Review of Systems:  No other skin or systemic complaints except as noted in HPI or Assessment and Plan.  Objective  Well appearing patient in no apparent distress; mood and affect are within normal limits.  A focused examination was performed including face. Relevant physical exam findings are noted in the Assessment and Plan.  Objective  Face: Rhytides and volume loss.   Images     Assessment & Plan  Elastosis of skin Face  Botox 53 units injected as marked: - The frown complex 20 units - The B/L crow's feet 10 units each - The B/L DAO's 4 units each  - The forehead 5 untis   Botox Injection - Face Location: See attached image  Informed consent: Discussed risks (infection, pain, bleeding, bruising, swelling, allergic reaction, paralysis of nearby muscles, eyelid droop, double vision, neck weakness, difficulty breathing, headache, undesirable cosmetic result, and need for additional treatment) and benefits of the procedure, as well as the alternatives.  Informed consent was obtained.  Preparation: The area was cleansed with alcohol.  Procedure Details:  Botox was injected into the dermis with a 30-gauge needle. Pressure applied to any bleeding. Ice packs offered for swelling.  Lot Number:  [id number] Expiration:  06/2022  Total Units Injected:  53  Plan: Patient was instructed to remain upright for 4 hours. Patient was instructed to avoid massaging the face and avoid vigorous exercise for the rest of the day. Tylenol may be used for headache.  Allow 2 weeks before returning to clinic for additional dosing as needed. Patient will call for any  problems.   Return in about 4 months (around 09/24/2020) for cosmetic - Botox.  Maylene Roes, CMA, am acting as scribe for Armida Sans, MD .  Documentation: I have reviewed the above documentation for accuracy and completeness, and I agree with the above.  Armida Sans, MD

## 2020-05-25 ENCOUNTER — Encounter: Payer: Self-pay | Admitting: Dermatology

## 2020-06-20 ENCOUNTER — Ambulatory Visit: Payer: BC Managed Care – PPO

## 2020-07-21 DIAGNOSIS — F419 Anxiety disorder, unspecified: Secondary | ICD-10-CM | POA: Diagnosis not present

## 2020-07-21 DIAGNOSIS — E7800 Pure hypercholesterolemia, unspecified: Secondary | ICD-10-CM | POA: Diagnosis not present

## 2020-07-21 DIAGNOSIS — Z20822 Contact with and (suspected) exposure to covid-19: Secondary | ICD-10-CM | POA: Diagnosis not present

## 2020-08-18 ENCOUNTER — Other Ambulatory Visit: Payer: Self-pay | Admitting: Obstetrics and Gynecology

## 2020-09-27 ENCOUNTER — Ambulatory Visit (INDEPENDENT_AMBULATORY_CARE_PROVIDER_SITE_OTHER): Payer: Self-pay | Admitting: Dermatology

## 2020-09-27 ENCOUNTER — Other Ambulatory Visit: Payer: Self-pay

## 2020-09-27 DIAGNOSIS — L988 Other specified disorders of the skin and subcutaneous tissue: Secondary | ICD-10-CM

## 2020-09-27 NOTE — Progress Notes (Signed)
    Follow-Up Visit   Subjective  Brittany Hunt is a 45 y.o. female who presents for the following: Facial Elastosis (Patient is here today for Botox injections - she is please with the previous pattern and amount of units).  The following portions of the chart were reviewed this encounter and updated as appropriate:   Tobacco  Allergies  Meds  Problems  Med Hx  Surg Hx  Fam Hx     Review of Systems:  No other skin or systemic complaints except as noted in HPI or Assessment and Plan.  Objective  Well appearing patient in no apparent distress; mood and affect are within normal limits.  A focused examination was performed including the face. Relevant physical exam findings are noted in the Assessment and Plan.  Objective  Face: Rhytides and volume loss.   Images    Assessment & Plan  Elastosis of skin Face  Botox 53 units injected as marked:  - The frown complex 20 units - The B/L crow's feet 10 units each - The B/L DAO's 4 units each  - The forehead 5 units  Botox Injection - Face Location: See attached image  Informed consent: Discussed risks (infection, pain, bleeding, bruising, swelling, allergic reaction, paralysis of nearby muscles, eyelid droop, double vision, neck weakness, difficulty breathing, headache, undesirable cosmetic result, and need for additional treatment) and benefits of the procedure, as well as the alternatives.  Informed consent was obtained.  Preparation: The area was cleansed with alcohol.  Procedure Details:  Botox was injected into the dermis with a 30-gauge needle. Pressure applied to any bleeding. Ice packs offered for swelling.  Lot Number:  [id number] Expiration:  11/2022  Total Units Injected:  53  Plan: Patient was instructed to remain upright for 4 hours. Patient was instructed to avoid massaging the face and avoid vigorous exercise for the rest of the day. Tylenol may be used for headache.  Allow 2 weeks before returning to  clinic for additional dosing as needed. Patient will call for any problems.   Return for Botox injections in 3-4 months.  Maylene Roes, CMA, am acting as scribe for Armida Sans, MD .  Documentation: I have reviewed the above documentation for accuracy and completeness, and I agree with the above.  Armida Sans, MD

## 2020-10-01 ENCOUNTER — Encounter: Payer: Self-pay | Admitting: Dermatology

## 2020-10-19 ENCOUNTER — Ambulatory Visit: Payer: BC Managed Care – PPO | Admitting: Obstetrics and Gynecology

## 2020-10-19 DIAGNOSIS — E7800 Pure hypercholesterolemia, unspecified: Secondary | ICD-10-CM | POA: Diagnosis not present

## 2020-12-03 DIAGNOSIS — N3000 Acute cystitis without hematuria: Secondary | ICD-10-CM | POA: Diagnosis not present

## 2020-12-27 NOTE — Progress Notes (Deleted)
45 y.o. G77P0102 Married Caucasian female here for annual exam.    PCP:     No LMP recorded.           Sexually active: {yes no:314532}  The current method of family planning is ***none.    Exercising: {yes no:314532}  {types:19826} Smoker:  no  Health Maintenance: Pap:  05-15-17 Neg:Neg HR HPV, 12-20-14 Neg:Neg HR HPV, 11-13-12 ASCUS:Neg HR HPV History of abnormal Pap:  Yes, Hx of cryotherapy age 46 MMG: 10-22-19 3D/Neg/BiRads1 Colonoscopy:  *** BMD:   ***  Result  *** TDaP:  11-10-12 Gardasil:   no HIV: done in past with fertility treatments Hep C: done in the past with fertility treatments Screening Labs:  Hb today: ***, Urine today: ***   reports that she has never smoked. She has never used smokeless tobacco. She reports current alcohol use of about 1.0 standard drink of alcohol per week. She reports that she does not use drugs.  Past Medical History:  Diagnosis Date  . Anxiety   . Elevated ALT measurement 2019  . Endometriosis 2004  . Hyperlipidemia 2016  . Infertility, female 49   INVETRO @ [facility]    Past Surgical History:  Procedure Laterality Date  . CESAREAN SECTION  2005   [facility]  . PELVIC LAPAROSCOPY  2004    Current Outpatient Medications  Medication Sig Dispense Refill  . ALPRAZolam (XANAX) 0.25 MG tablet Take 0.25 mg by mouth at bedtime.   0  . fenofibrate (TRICOR) 145 MG tablet Take 1 tablet by mouth daily.  1  . JUNEL FE 1/20 1-20 MG-MCG tablet TAKE 1 TABLET BY MOUTH EVERY DAY 84 tablet 0  . Melatonin 10 MG CAPS Take 1 capsule by mouth at bedtime.    Marland Kitchen omega-3 acid ethyl esters (LOVAZA) 1 g capsule Take 1 g by mouth daily.     No current facility-administered medications for this visit.    Family History  Problem Relation Age of Onset  . Diabetes Mother   . Thyroid disease Mother   . Thyroid disease Sister   . Cancer Sister        Dec Lung Ca age 65  . Heart failure Maternal Grandmother   . Heart disease Paternal Grandmother   . Heart attack  Sister 40       had pacemaker placed  . Diabetes Sister 37       Insulin dependent  . Breast cancer Neg Hx     Review of Systems  Exam:   There were no vitals taken for this visit.    General appearance: alert, cooperative and appears stated age Head: normocephalic, without obvious abnormality, atraumatic Neck: no adenopathy, supple, symmetrical, trachea midline and thyroid normal to inspection and palpation Lungs: clear to auscultation bilaterally Breasts: normal appearance, no masses or tenderness, No nipple retraction or dimpling, No nipple discharge or bleeding, No axillary adenopathy Heart: regular rate and rhythm Abdomen: soft, non-tender; no masses, no organomegaly Extremities: extremities normal, atraumatic, no cyanosis or edema Skin: skin color, texture, turgor normal. No rashes or lesions Lymph nodes: cervical, supraclavicular, and axillary nodes normal. Neurologic: grossly normal  Pelvic: External genitalia:  no lesions              No abnormal inguinal nodes palpated.              Urethra:  normal appearing urethra with no masses, tenderness or lesions  Bartholins and Skenes: normal                 Vagina: normal appearing vagina with normal color and discharge, no lesions              Cervix: no lesions              Pap taken: {yes no:314532} Bimanual Exam:  Uterus:  normal size, contour, position, consistency, mobility, non-tender              Adnexa: no mass, fullness, tenderness              Rectal exam: {yes no:314532}.  Confirms.              Anus:  normal sphincter tone, no lesions  Chaperone was present for exam.  Assessment:   Well woman visit with normal exam.   Plan: Mammogram screening discussed. Self breast awareness reviewed. Pap and HR HPV as above. Guidelines for Calcium, Vitamin D, regular exercise program including cardiovascular and weight bearing exercise.   Follow up annually and prn.   Additional counseling given.  {yes  [id number]. _______ minutes face to face time of which over 50% was spent in counseling.    After visit summary provided.

## 2020-12-29 ENCOUNTER — Ambulatory Visit: Payer: BC Managed Care – PPO | Admitting: Obstetrics and Gynecology

## 2020-12-29 DIAGNOSIS — Z0289 Encounter for other administrative examinations: Secondary | ICD-10-CM

## 2021-02-07 ENCOUNTER — Other Ambulatory Visit: Payer: Self-pay

## 2021-02-07 ENCOUNTER — Ambulatory Visit (INDEPENDENT_AMBULATORY_CARE_PROVIDER_SITE_OTHER): Payer: Self-pay | Admitting: Dermatology

## 2021-02-07 DIAGNOSIS — L988 Other specified disorders of the skin and subcutaneous tissue: Secondary | ICD-10-CM

## 2021-02-07 NOTE — Progress Notes (Signed)
   Follow-Up Visit   Subjective  Brittany Hunt is a 45 y.o. female who presents for the following: Facial Elastosis (Face, pt presents for Botox).  The following portions of the chart were reviewed this encounter and updated as appropriate:   Tobacco  Allergies  Meds  Problems  Med Hx  Surg Hx  Fam Hx      Review of Systems:  No other skin or systemic complaints except as noted in HPI or Assessment and Plan.  Objective  Well appearing patient in no apparent distress; mood and affect are within normal limits.  A focused examination was performed including face. Relevant physical exam findings are noted in the Assessment and Plan.  face Rhytides and volume loss.       Assessment & Plan  Elastosis of skin face  Botox 53 units injected to: - Frown complex 20 units - Crow's feet 10 units x 2 - Forehead 5 units - DAO's 4 units x 2  Intralesional injection - face Location: Frown complex, crow's feet, forehead, DAO's  Informed consent: Discussed risks (infection, pain, bleeding, bruising, swelling, allergic reaction, paralysis of nearby muscles, eyelid droop, double vision, neck weakness, difficulty breathing, headache, undesirable cosmetic result, and need for additional treatment) and benefits of the procedure, as well as the alternatives.  Informed consent was obtained.  Preparation: The area was cleansed with alcohol.  Procedure Details:  Botox was injected into the dermis with a 30-gauge needle. Pressure applied to any bleeding. Ice packs offered for swelling.  Lot Number:  [id number] Expiration:  05/2022  Total Units Injected:  53  Plan: Patient was instructed to remain upright for 4 hours. Patient was instructed to avoid massaging the face and avoid vigorous exercise for the rest of the day. Tylenol may be used for headache.  Allow 2 weeks before returning to clinic for additional dosing as needed. Patient will call for any problems.   Return for 3-56m  Botox.  I, Ardis Rowan, RMA, am acting as scribe for Armida Sans, MD . Documentation: I have reviewed the above documentation for accuracy and completeness, and I agree with the above.  Armida Sans, MD

## 2021-02-07 NOTE — Patient Instructions (Signed)

## 2021-02-10 ENCOUNTER — Encounter: Payer: Self-pay | Admitting: Dermatology

## 2021-04-11 ENCOUNTER — Other Ambulatory Visit: Payer: Self-pay | Admitting: Obstetrics and Gynecology

## 2021-04-13 DIAGNOSIS — N10 Acute tubulo-interstitial nephritis: Secondary | ICD-10-CM | POA: Diagnosis not present

## 2021-04-14 ENCOUNTER — Ambulatory Visit
Admission: RE | Admit: 2021-04-14 | Discharge: 2021-04-14 | Disposition: A | Discharge status: Home / Self Care | Payer: BC Managed Care – PPO | Source: Ambulatory Visit | Attending: Family Medicine | Admitting: Family Medicine

## 2021-04-14 ENCOUNTER — Other Ambulatory Visit: Payer: Self-pay

## 2021-04-14 VITALS — BP 135/91 | HR 86 | Temp 98.7°F | Resp 18 | Ht 64.0 in | Wt 145.0 lb

## 2021-04-14 DIAGNOSIS — N3000 Acute cystitis without hematuria: Secondary | ICD-10-CM

## 2021-04-14 LAB — URINALYSIS, COMPLETE (UACMP) WITH MICROSCOPIC

## 2021-04-14 NOTE — ED Triage Notes (Signed)
Pt c/o urinary frequency/urgency and some burning. Pt did a video visit and was given cephalexin. Pt has taken 3 doses. Pt reports back pain and chills. Pt also reports she recently injured her back (fell out of a chair) and is not sure if the back pain is related to this. Pt denies f/n/v/d, hematuria or other symptoms.

## 2021-04-14 NOTE — ED Provider Notes (Signed)
[facility]    CSN: [id number] Arrival date & time: 04/14/21  1123      History   Chief Complaint Chief Complaint  Patient presents with   Urinary Frequency    HPI 45 year old female presents with urinary symptoms.  Patient reports that she has had urinary symptoms for the past 4 days.  She reports urinary frequency, urgency.  She also reports some mild dysuria.  She has been treating herself with over-the-counter medication and had a virtual visit yesterday.  She was prescribed Keflex.  She has taken 3 doses.  Patient reports that she has had some chills.  She is also having back pain.  She is unsure if the back pain is related to her urinary symptoms or whether it is due from a recent fall out of a chair.  No nausea, vomiting, diarrhea.  No documented fever.  No hematuria.  Denies back pain.  Denies abdominal pain.  No other complaints.  Past Medical History:  Diagnosis Date   Anxiety    Elevated ALT measurement 2019   Endometriosis 2004   Hyperlipidemia 2016   Infertility, female 27   INVETRO @ [facility]   Past Surgical History:  Procedure Laterality Date   CESAREAN SECTION  2005   [facility]   PELVIC LAPAROSCOPY  2004    OB History     Gravida  1   Para  1   Term  0   Preterm  1   AB  0   Living  2      SAB  0   IAB  0   Ectopic  0   Multiple  1   Live Births  2            Home Medications    Prior to Admission medications   Medication Sig Start Date End Date Taking? Authorizing Provider  ALPRAZolam (XANAX) 0.25 MG tablet Take 0.25 mg by mouth at bedtime.  11/29/14  Yes [provider]  cephALEXin (KEFLEX) 500 MG capsule Take 500 mg by mouth 2 (two) times daily. 04/13/21  Yes [provider]  fenofibrate (TRICOR) 145 MG tablet Take 1 tablet by mouth daily. 05/05/17  Yes [provider]  JUNEL FE 1/20 1-20 MG-MCG tablet TAKE 1 TABLET BY MOUTH EVERY DAY 08/18/20  Yes Romualdo Bolk, MD  Melatonin 10  MG CAPS Take 1 capsule by mouth at bedtime.   Yes [provider]  omega-3 acid ethyl esters (LOVAZA) 1 g capsule Take 1 g by mouth daily.   Yes [provider]    Family History Family History  Problem Relation Age of Onset   Diabetes Mother    Thyroid disease Mother    Thyroid disease Sister    Cancer Sister        Dec Lung Ca age 22   Heart failure Maternal Grandmother    Heart disease Paternal Grandmother    Heart attack Sister 16       had pacemaker placed   Diabetes Sister 29       Insulin dependent   Breast cancer Neg Hx     Social History Social History   Tobacco Use   Smoking status: Never   Smokeless tobacco: Never  Vaping Use   Vaping Use: Never used  Substance Use Topics   Alcohol use: Yes    Alcohol/week: 1.0 standard drink    Types: 1 Glasses of wine per week   Drug use: No  Allergies   Codeine   Review of Systems Review of Systems Per HPI  Physical Exam Triage Vital Signs ED Triage Vitals  Enc Vitals Group     BP 04/14/21 1136 (!) 135/91     Pulse Rate 04/14/21 1136 86     Resp 04/14/21 1136 18     Temp 04/14/21 1136 98.7 F (37.1 C)     Temp Source 04/14/21 1136 Oral     SpO2 04/14/21 1136 96 %     Weight 04/14/21 1135 145 lb (65.8 kg)     Height 04/14/21 1135 5\' 4"  (1.626 m)     Head Circumference --      Peak Flow --      Pain Score 04/14/21 1134 0     Pain Loc --      Pain Edu? --      Excl. in GC? --    Updated Vital Signs BP (!) 135/91 (BP Location: Left Arm)   Pulse 86   Temp 98.7 F (37.1 C) (Oral)   Resp 18   Ht 5\' 4"  (1.626 m)   Wt 65.8 kg   LMP 03/15/2021 (Approximate)   SpO2 96%   BMI 24.89 kg/m   Visual Acuity Right Eye Distance:   Left Eye Distance:   Bilateral Distance:    Right Eye Near:   Left Eye Near:    Bilateral Near:     Physical Exam Vitals and nursing note reviewed.  Constitutional:      General: She is not in acute distress.    Appearance: Normal appearance. She is  not ill-appearing.  HENT:     Head: Normocephalic and atraumatic.  Eyes:     General:        Right eye: No discharge.        Left eye: No discharge.     Conjunctiva/sclera: Conjunctivae normal.  Cardiovascular:     Rate and Rhythm: Normal rate and regular rhythm.  Pulmonary:     Effort: Pulmonary effort is normal.     Breath sounds: Normal breath sounds. No wheezing, rhonchi or rales.  Abdominal:     General: There is no distension.     Palpations: Abdomen is soft.     Tenderness: no abdominal tenderness There is no right CVA tenderness or left CVA tenderness.  Neurological:     Mental Status: She is alert.  Psychiatric:        Mood and Affect: Mood normal.        Behavior: Behavior normal.     UC Treatments / Results  Labs (all labs ordered are listed, but only abnormal results are displayed) Labs Reviewed  URINALYSIS, COMPLETE (UACMP) WITH MICROSCOPIC - Abnormal; Notable for the following components:      Result Value   Color, Urine ORANGE (*)    APPearance HAZY (*)    Glucose, UA   (*)    Value: TEST NOT REPORTED DUE TO COLOR INTERFERENCE OF URINE PIGMENT   Hgb urine dipstick   (*)    Value: TEST NOT REPORTED DUE TO COLOR INTERFERENCE OF URINE PIGMENT   Bilirubin Urine   (*)    Value: TEST NOT REPORTED DUE TO COLOR INTERFERENCE OF URINE PIGMENT   Ketones, ur   (*)    Value: TEST NOT REPORTED DUE TO COLOR INTERFERENCE OF URINE PIGMENT   Protein, ur   (*)    Value: TEST NOT REPORTED DUE TO COLOR INTERFERENCE OF URINE PIGMENT   Nitrite   (*)  Value: TEST NOT REPORTED DUE TO COLOR INTERFERENCE OF URINE PIGMENT   Leukocytes,Ua   (*)    Value: TEST NOT REPORTED DUE TO COLOR INTERFERENCE OF URINE PIGMENT   Bacteria, UA FEW (*)    All other components within normal limits    EKG   Radiology No results found.  Procedures Procedures (including critical care time)  Medications Ordered in UC Medications - No data to display  Initial Impression / Assessment and  Plan / UC Course  I have reviewed the triage vital signs and the nursing notes.  Pertinent labs & imaging results that were available during my care of the patient were reviewed by me and considered in my medical decision making (see chart for details).    45 year old female presents with UTI.  Patient recently treated with Keflex via a virtual visit.  Her urinalysis today did not reveal any pyuria.  Sending culture.  I see no evidence of clinical worsening.  Advised to continue Keflex as prescribed.  Supportive care.  Final Clinical Impressions(s) / UC Diagnoses   Final diagnoses:  Acute cystitis without hematuria     Discharge Instructions      Continue the Keflex.  Your urine looks clear.  Take care  Dr. Adriana Simas      ED Prescriptions   None    PDMP not reviewed this encounter.   Tommie Sams, [place] 04/14/21 1214

## 2021-04-14 NOTE — Discharge Instructions (Signed)
Continue the Keflex.  Your urine looks clear.  Take care  Dr. Adriana Simas

## 2021-04-16 LAB — URINE CULTURE: Culture: NO GROWTH

## 2021-05-23 ENCOUNTER — Encounter: Payer: Self-pay | Admitting: Dermatology

## 2021-05-23 ENCOUNTER — Ambulatory Visit (INDEPENDENT_AMBULATORY_CARE_PROVIDER_SITE_OTHER): Payer: Self-pay | Admitting: Dermatology

## 2021-05-23 ENCOUNTER — Other Ambulatory Visit: Payer: Self-pay

## 2021-05-23 DIAGNOSIS — L988 Other specified disorders of the skin and subcutaneous tissue: Secondary | ICD-10-CM

## 2021-05-23 NOTE — Patient Instructions (Signed)

## 2021-05-23 NOTE — Progress Notes (Signed)
   Follow-Up Visit   Subjective  Brittany Hunt is a 45 y.o. female who presents for the following: Facial Elastosis (Botox today).  The following portions of the chart were reviewed this encounter and updated as appropriate:   Tobacco  Allergies  Meds  Problems  Med Hx  Surg Hx  Fam Hx     Review of Systems:  No other skin or systemic complaints except as noted in HPI or Assessment and Plan.  Objective  Well appearing patient in no apparent distress; mood and affect are within normal limits.  A focused examination was performed including face. Relevant physical exam findings are noted in the Assessment and Plan.  Head - Anterior (Face) Rhytides and volume loss.       Assessment & Plan  Elastosis of skin Head - Anterior (Face)  Botox today - 53 units   Frown complex 20 units Forehead 5 units Crow's feet 10 units each side DAOs 4 units each side  Filling material injection - Head - Anterior (Face) Location: See attached image  Informed consent: Discussed risks (infection, pain, bleeding, bruising, swelling, allergic reaction, paralysis of nearby muscles, eyelid droop, double vision, neck weakness, difficulty breathing, headache, undesirable cosmetic result, and need for additional treatment) and benefits of the procedure, as well as the alternatives.  Informed consent was obtained.  Preparation: The area was cleansed with alcohol.  Procedure Details:  Botox was injected into the dermis with a 30-gauge needle. Pressure applied to any bleeding. Ice packs offered for swelling.  Lot Number:  [id number] Expiration:  11/2022  Total Units Injected:  53  Plan: Patient was instructed to remain upright for 4 hours. Patient was instructed to avoid massaging the face and avoid vigorous exercise for the rest of the day. Tylenol may be used for headache.  Allow 2 weeks before returning to clinic for additional dosing as needed. Patient will call for any  problems.   Return for Botox in 3-4 months.  I, Joanie Coddington, CMA, am acting as scribe for Armida Sans, MD . Documentation: I have reviewed the above documentation for accuracy and completeness, and I agree with the above.  Armida Sans, MD

## 2021-06-27 DIAGNOSIS — F419 Anxiety disorder, unspecified: Secondary | ICD-10-CM | POA: Diagnosis not present

## 2021-06-27 DIAGNOSIS — Z0000 Encounter for general adult medical examination without abnormal findings: Secondary | ICD-10-CM | POA: Diagnosis not present

## 2021-06-27 DIAGNOSIS — E7800 Pure hypercholesterolemia, unspecified: Secondary | ICD-10-CM | POA: Diagnosis not present

## 2021-06-27 DIAGNOSIS — M542 Cervicalgia: Secondary | ICD-10-CM | POA: Diagnosis not present

## 2021-08-01 ENCOUNTER — Other Ambulatory Visit: Payer: Self-pay | Admitting: Obstetrics and Gynecology

## 2021-08-01 NOTE — Telephone Encounter (Signed)
Annual exam scheduled on 10/23/21 Last annual exam was 09/2019 Last mammogram was 10/2019

## 2021-08-02 ENCOUNTER — Other Ambulatory Visit: Payer: Self-pay | Admitting: *Deleted

## 2021-08-02 MED ORDER — NORETHIN ACE-ETH ESTRAD-FE 1-20 MG-MCG PO TABS: 1.0000 | ORAL_TABLET | Freq: Every day | ORAL | 0 refills | Status: AC

## 2021-08-02 NOTE — Telephone Encounter (Signed)
-----   Message from Reatha Armour, [place] sent at 08/02/2021 11:11 AM EST ----- I found an appt for this pt on feb/2. There is nothing else sooner. DR Edward Jolly wanted there to come sooner but nowhere to put her. Pt said she needs her oc's refilled.  ----- Message ----- From: Aura Camps, RMA Sent: 08/02/2021   9:21 AM EST To: [facility] Appointments  Dr.Silva denied patient refill for BCP her annual exam is in 3/23. Dr. Edward Jolly said move up annual exam.

## 2021-08-02 NOTE — Telephone Encounter (Signed)
Sent message to appointments to move appointment up.

## 2021-08-02 NOTE — Telephone Encounter (Signed)
Dr.Jertson  patient is now scheduled 09/18/21 @ 1:30pm sorry I put the wrong provider name on the first message. Brittany Hunt has her scheduled with you.

## 2021-08-29 ENCOUNTER — Encounter: Payer: Self-pay | Admitting: Dermatology

## 2021-08-29 ENCOUNTER — Other Ambulatory Visit: Payer: Self-pay

## 2021-08-29 ENCOUNTER — Ambulatory Visit (INDEPENDENT_AMBULATORY_CARE_PROVIDER_SITE_OTHER): Payer: Self-pay | Admitting: Dermatology

## 2021-08-29 DIAGNOSIS — L988 Other specified disorders of the skin and subcutaneous tissue: Secondary | ICD-10-CM

## 2021-08-29 NOTE — Patient Instructions (Signed)

## 2021-08-29 NOTE — Progress Notes (Signed)
° °  Follow-Up Visit   Subjective  Brittany Hunt is a 46 y.o. female who presents for the following: Facial Elastosis (Here for Botox.).  The following portions of the chart were reviewed this encounter and updated as appropriate:  Tobacco   Allergies   Meds   Problems   Med Hx   Surg Hx   Fam Hx      Review of Systems: No other skin or systemic complaints except as noted in HPI or Assessment and Plan.  Objective  Well appearing patient in no apparent distress; mood and affect are within normal limits.  A focused examination was performed including face. Relevant physical exam findings are noted in the Assessment and Plan.  face Rhytides and volume loss.       Assessment & Plan  Elastosis of skin face  Botox today - 53 units    Frown complex 20 units Forehead 5 units Crow's feet 10 units each side DAOs 4 units each side   Patient will schedule for laser treatment.   Botox Injection - face Location: See attached image  Informed consent: Discussed risks (infection, pain, bleeding, bruising, swelling, allergic reaction, paralysis of nearby muscles, eyelid droop, double vision, neck weakness, difficulty breathing, headache, undesirable cosmetic result, and need for additional treatment) and benefits of the procedure, as well as the alternatives.  Informed consent was obtained.  Preparation: The area was cleansed with alcohol.  Procedure Details:  Botox was injected into the dermis with a 30-gauge needle. Pressure applied to any bleeding. Ice packs offered for swelling.  Lot Number:  [id number] Expiration:  09/2023  Total Units Injected:  53  Plan: Patient was instructed to remain upright for 4 hours. Patient was instructed to avoid massaging the face and avoid vigorous exercise for the rest of the day. Tylenol may be used for headache.  Allow 2 weeks before returning to clinic for additional dosing as needed. Patient will call for any problems.    Return for Botox 3-4  months.  I, Lawson Radar, CMA, am acting as scribe for Armida Sans, MD. Documentation: I have reviewed the above documentation for accuracy and completeness, and I agree with the above.  Armida Sans, MD

## 2021-08-30 ENCOUNTER — Encounter: Payer: Self-pay | Admitting: Dermatology

## 2021-09-12 ENCOUNTER — Other Ambulatory Visit: Payer: Self-pay | Admitting: *Deleted

## 2021-09-12 NOTE — Telephone Encounter (Signed)
Patient scheduled on 10/19/21 requesting on refill for birth control pills.  Last annual exam 09/2019  Last mammogram 10/2019

## 2021-09-12 NOTE — Progress Notes (Deleted)
46 y.o. G58P0102 Married White or Caucasian Not Hispanic or Latino female here for annual exam.      No LMP recorded.          Sexually active: {yes no:314532}  The current method of family planning is {contraception:315051}.    Exercising: {yes no:314532}  {types:19826} Smoker:  {YES NO:22349}  Health Maintenance: Pap:  *** History of abnormal Pap:  {YES NO:22349} MMG:  *** BMD:   *** Colonoscopy: *** TDaP:  *** Gardasil: ***   reports that she has never smoked. She has never used smokeless tobacco. She reports current alcohol use of about 1.0 standard drink per week. She reports that she does not use drugs.  Past Medical History:  Diagnosis Date   Anxiety    Elevated ALT measurement 2019   Endometriosis 2004   Hyperlipidemia 2016   Infertility, female 2005   INVETRO @ [facility]    Past Surgical History:  Procedure Laterality Date   CESAREAN SECTION  2005   [facility]   PELVIC LAPAROSCOPY  2004    Current Outpatient Medications  Medication Sig Dispense Refill   ALPRAZolam (XANAX) 0.25 MG tablet Take 0.25 mg by mouth at bedtime.   0   cephALEXin (KEFLEX) 500 MG capsule Take 500 mg by mouth 2 (two) times daily.     fenofibrate (TRICOR) 145 MG tablet Take 1 tablet by mouth daily.  1   Melatonin 10 MG CAPS Take 1 capsule by mouth at bedtime.     norethindrone-ethinyl estradiol-FE (JUNEL FE 1/20) 1-20 MG-MCG tablet Take 1 tablet by mouth daily. 84 tablet 0   omega-3 acid ethyl esters (LOVAZA) 1 g capsule Take 1 g by mouth daily.     No current facility-administered medications for this visit.    Family History  Problem Relation Age of Onset   Diabetes Mother    Thyroid disease Mother    Thyroid disease Sister    Cancer Sister        Dec Lung Ca age 85   Heart failure Maternal Grandmother    Heart disease Paternal Grandmother    Heart attack Sister 73       had pacemaker placed   Diabetes Sister 37       Insulin dependent   Breast cancer Neg Hx     Review of  Systems  Exam:   There were no vitals taken for this visit.  Weight change: @WEIGHTCHANGE @ Height:      Ht Readings from Last 3 Encounters:  04/14/21 5\' 4"  (1.626 m)  09/23/19 5' 4.25" (1.632 m)  06/10/18 5' 3.75" (1.619 m)    General appearance: alert, cooperative and appears stated age Head: Normocephalic, without obvious abnormality, atraumatic Neck: no adenopathy, supple, symmetrical, trachea midline and thyroid {CHL AMB PHY EX THYROID NORM [phone]::"normal to inspection and palpation"} Lungs: clear to auscultation bilaterally Cardiovascular: regular rate and rhythm Breasts: {Exam; breast:13139::"normal appearance, no masses or tenderness"} Abdomen: soft, non-tender; non distended,  no masses,  no organomegaly Extremities: extremities normal, atraumatic, no cyanosis or edema Skin: Skin color, texture, turgor normal. No rashes or lesions Lymph nodes: Cervical, supraclavicular, and axillary nodes normal. No abnormal inguinal nodes palpated Neurologic: Grossly normal   Pelvic: External genitalia:  no lesions              Urethra:  normal appearing urethra with no masses, tenderness or lesions              Bartholins and Skenes: normal  Vagina: normal appearing vagina with normal color and discharge, no lesions              Cervix: {CHL AMB PHY EX CERVIX NORM DEFAULT:[phone]::"no lesions"}               Bimanual Exam:  Uterus:  {CHL AMB PHY EX UTERUS NORM DEFAULT:[phone]::"normal size, contour, position, consistency, mobility, non-tender"}              Adnexa: {CHL AMB PHY EX ADNEXA NO MASS DEFAULT:[phone]::"no mass, fullness, tenderness"}               Rectovaginal: Confirms               Anus:  normal sphincter tone, no lesions  *** chaperoned for the exam.  A:  Well Woman with normal exam  P:

## 2021-09-18 ENCOUNTER — Ambulatory Visit: Payer: BC Managed Care – PPO | Admitting: Obstetrics and Gynecology

## 2021-09-21 ENCOUNTER — Ambulatory Visit: Payer: BC Managed Care – PPO | Admitting: Obstetrics and Gynecology

## 2021-10-18 NOTE — Progress Notes (Deleted)
46 y.o. [id number] Married Caucasian female here for annual exam.   ? ?PCP:    ? ?No LMP recorded.     ?  ?    ?Sexually active: {yes no:314532}  ?The current method of family planning is {contraception:315051}.    ?Exercising: {yes no:314532}  {types:19826} ?Smoker:  no ? ?Health Maintenance: ?Pap:   05-15-17 Neg:Neg HR HPV, 12-20-14 Neg:Neg HR HPV, 11-13-12 ASCUS:Neg HR HPV ?History of abnormal Pap:  Yes, Hx of cryotherapy age 65 ?MMG:  ***10-22-19 Neg/Birads1 ?Colonoscopy:  *** ?BMD:   ***  Result  *** ?TDaP:  2014 ?Gardasil:   no ?HIV: Neg in the past ?Hep C: Neg in the past ?Screening Labs:  Hb today: ***, Urine today: *** ? ? reports that she has never smoked. She has never used smokeless tobacco. She reports current alcohol use of about 1.0 standard drink per week. She reports that she does not use drugs. ? ?Past Medical History:  ?Diagnosis Date  ? Anxiety   ? Elevated ALT measurement 2019  ? Endometriosis 2004  ? Hyperlipidemia 2016  ? Infertility, female 2005  ? INVETRO @ [facility]  ? ? ?Past Surgical History:  ?Procedure Laterality Date  ? CESAREAN SECTION  2005  ? [facility]  ? PELVIC LAPAROSCOPY  2004  ? ? ?Current Outpatient Medications  ?Medication Sig Dispense Refill  ? ALPRAZolam (XANAX) 0.25 MG tablet Take 0.25 mg by mouth at bedtime.   0  ? cephALEXin (KEFLEX) 500 MG capsule Take 500 mg by mouth 2 (two) times daily.    ? fenofibrate (TRICOR) 145 MG tablet Take 1 tablet by mouth daily.  1  ? Melatonin 10 MG CAPS Take 1 capsule by mouth at bedtime.    ? norethindrone-ethinyl estradiol-FE (JUNEL FE 1/20) 1-20 MG-MCG tablet Take 1 tablet by mouth daily. 84 tablet 0  ? omega-3 acid ethyl esters (LOVAZA) 1 g capsule Take 1 g by mouth daily.    ? ?No current facility-administered medications for this visit.  ? ? ?Family History  ?Problem Relation Age of Onset  ? Diabetes Mother   ? Thyroid disease Mother   ? Thyroid disease Sister   ? Cancer Sister   ?     Dec Lung Ca age 71  ? Heart failure Maternal Grandmother   ?  Heart disease Paternal Grandmother   ? Heart attack Sister 20  ?     had pacemaker placed  ? Diabetes Sister 58  ?     Insulin dependent  ? Breast cancer Neg Hx   ? ? ?Review of Systems ? ?Exam:   ?There were no vitals taken for this visit.    ?General appearance: alert, cooperative and appears stated age ?Head: normocephalic, without obvious abnormality, atraumatic ?Neck: no adenopathy, supple, symmetrical, trachea midline and thyroid normal to inspection and palpation ?Lungs: clear to auscultation bilaterally ?Breasts: normal appearance, no masses or tenderness, No nipple retraction or dimpling, No nipple discharge or bleeding, No axillary adenopathy ?Heart: regular rate and rhythm ?Abdomen: soft, non-tender; no masses, no organomegaly ?Extremities: extremities normal, atraumatic, no cyanosis or edema ?Skin: skin color, texture, turgor normal. No rashes or lesions ?Lymph nodes: cervical, supraclavicular, and axillary nodes normal. ?Neurologic: grossly normal ? ?Pelvic: External genitalia:  no lesions ?             No abnormal inguinal nodes palpated. ?             Urethra:  normal appearing urethra with no masses, tenderness or  lesions ?             Bartholins and Skenes: normal    ?             Vagina: normal appearing vagina with normal color and discharge, no lesions ?             Cervix: no lesions ?             Pap taken: {yes no:314532} ?Bimanual Exam:  Uterus:  normal size, contour, position, consistency, mobility, non-tender ?             Adnexa: no mass, fullness, tenderness ?             Rectal exam: {yes no:314532}.  Confirms. ?             Anus:  normal sphincter tone, no lesions ? ?Chaperone was present for exam:  *** ? ?Assessment:   ?Well woman visit with gynecologic exam. ? ? ?Plan: ?Mammogram screening discussed. ?Self breast awareness reviewed. ?Pap and HR HPV as above. ?Guidelines for Calcium, Vitamin D, regular exercise program including cardiovascular and weight bearing exercise. ?  ?Follow up  annually and prn.  ? ?Additional counseling given.  {yes [id number]. ?_______ minutes face to face time of which over 50% was spent in counseling.  ? ? ?After visit summary provided.  ? ? ? ? ?

## 2021-10-19 ENCOUNTER — Ambulatory Visit: Payer: BC Managed Care – PPO | Admitting: Obstetrics and Gynecology

## 2021-10-19 DIAGNOSIS — Z0289 Encounter for other administrative examinations: Secondary | ICD-10-CM

## 2021-10-23 ENCOUNTER — Ambulatory Visit: Payer: BC Managed Care – PPO | Admitting: Obstetrics and Gynecology

## 2021-10-28 ENCOUNTER — Other Ambulatory Visit: Payer: Self-pay | Admitting: Obstetrics and Gynecology

## 2021-11-10 DIAGNOSIS — J349 Unspecified disorder of nose and nasal sinuses: Secondary | ICD-10-CM | POA: Diagnosis not present

## 2021-12-06 IMAGING — MG DIGITAL SCREENING BILAT W/ TOMO W/ CAD
8 series · 8 of 24 positions shown · non-contrast
Comparison: Previous exam(s).

CLINICAL DATA: Screening.

EXAM:
DIGITAL SCREENING BILATERAL MAMMOGRAM WITH TOMO AND CAD

[R MLO synth-2D]
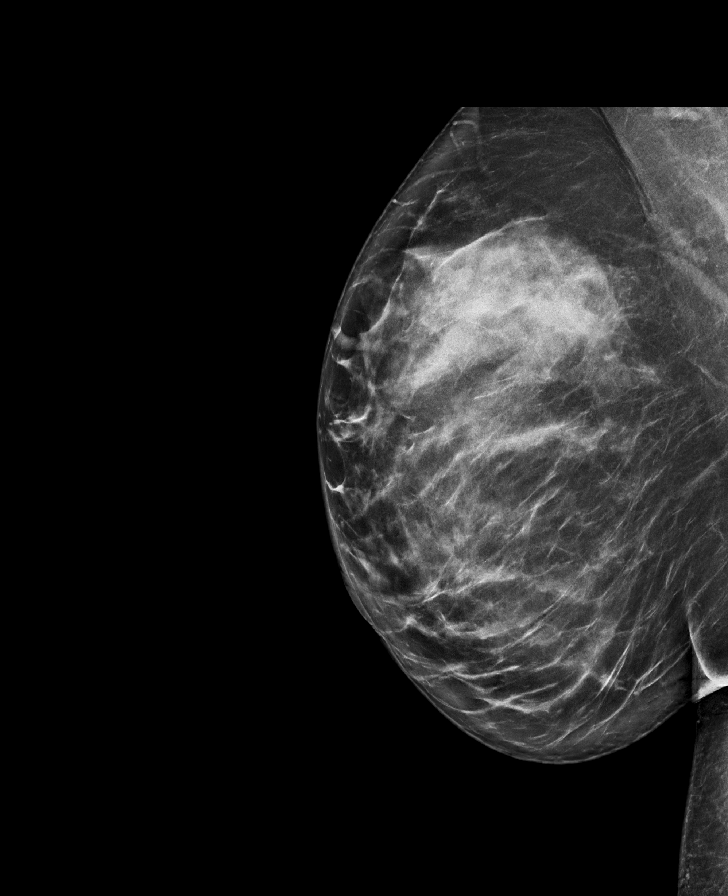

[R CC synth-2D]
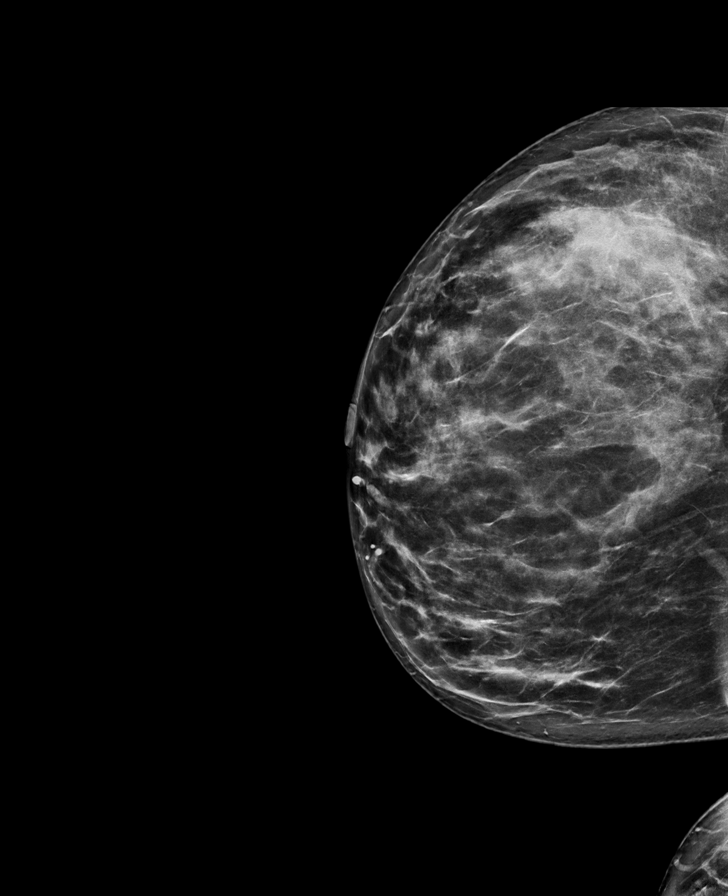

[L MLO synth-2D]
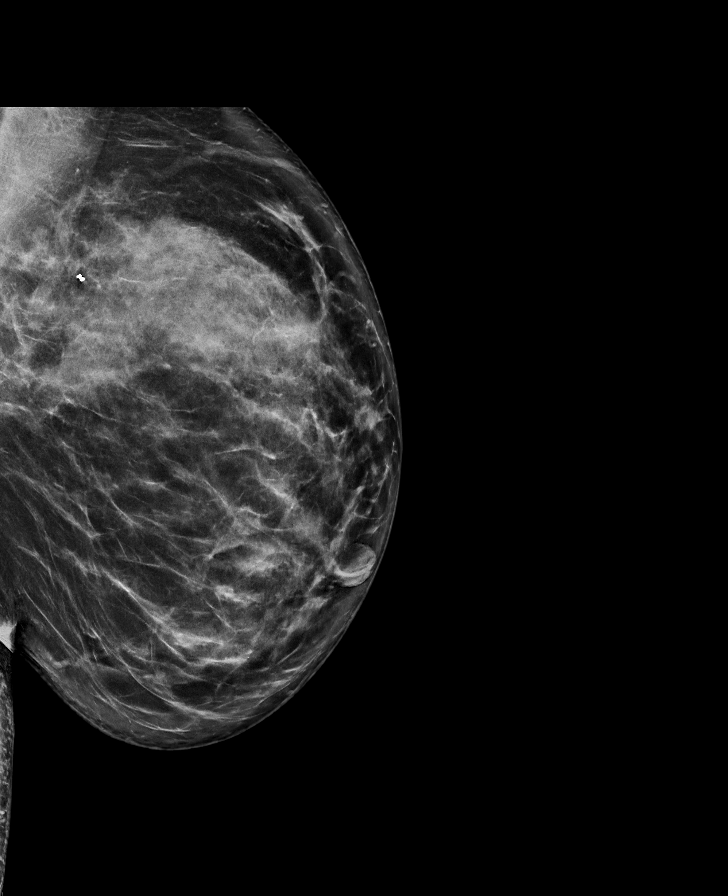

[L CC synth-2D]
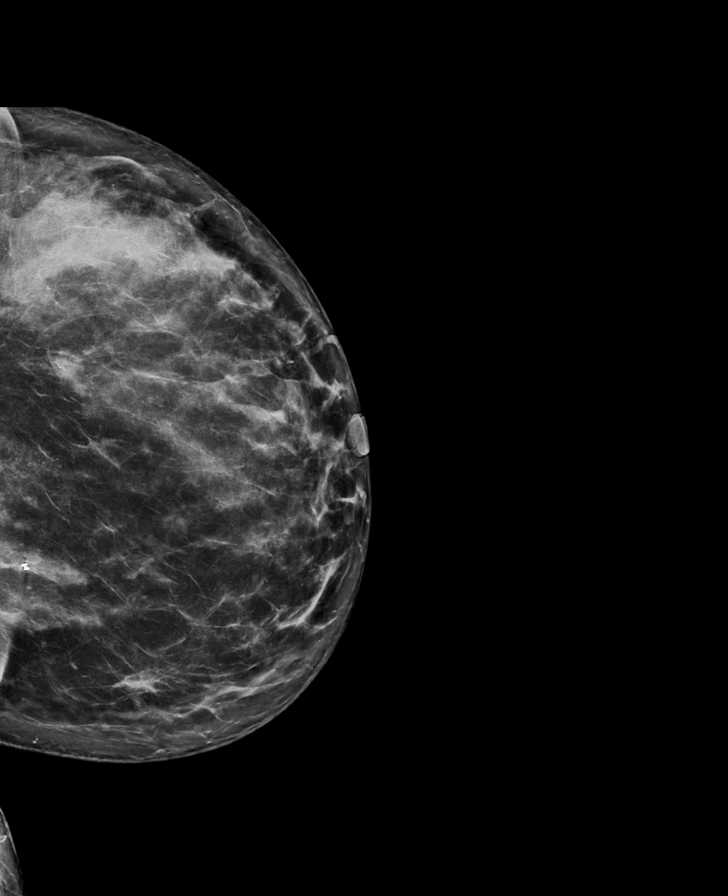

[L MLO tomo · tomo slice 51/100.0]
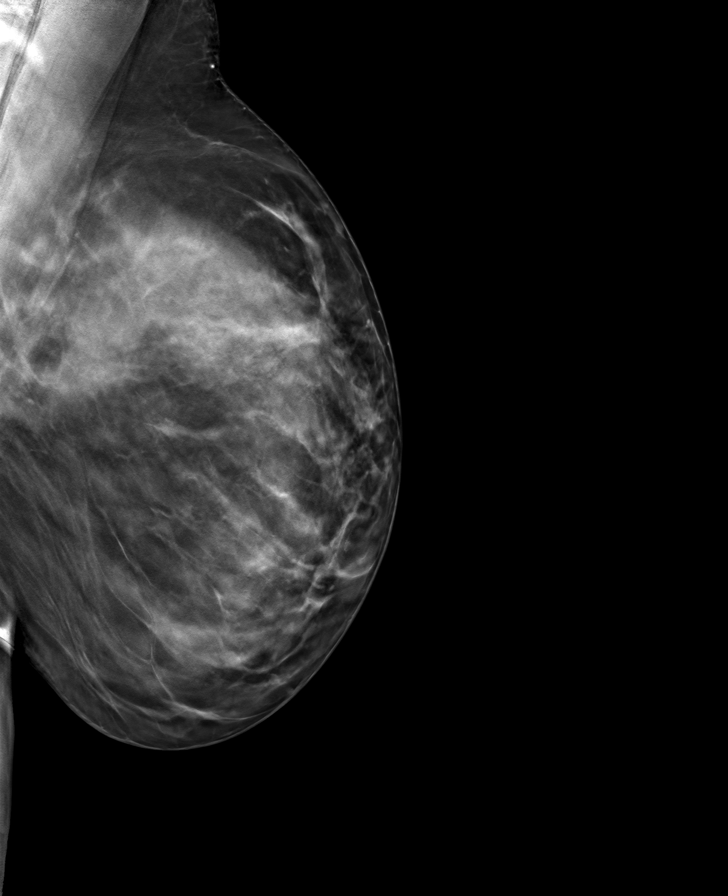

[R MLO tomo · tomo slice 51/101.0]
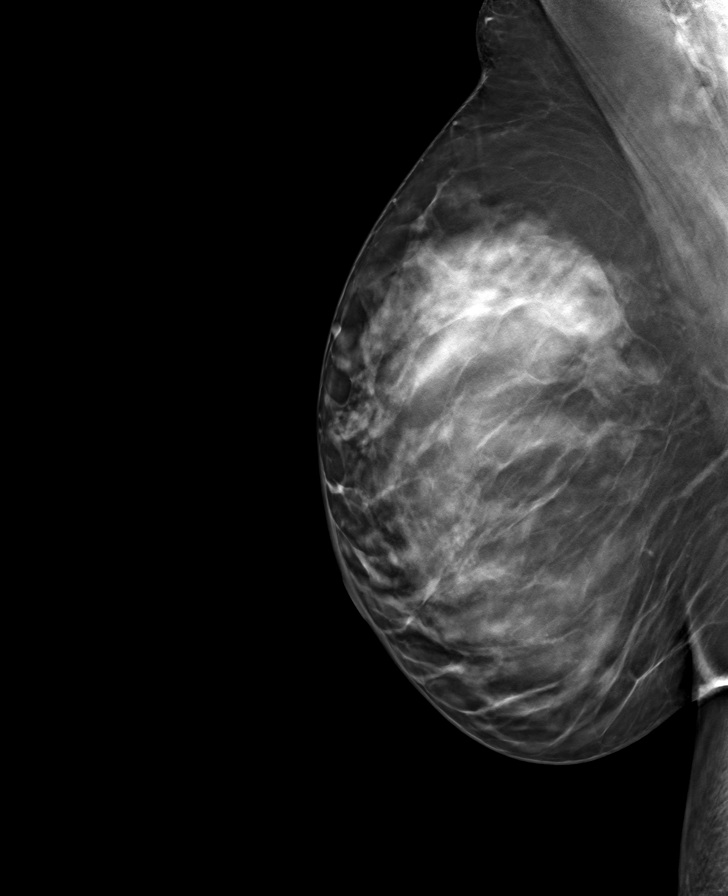

[L CC tomo · tomo slice 49/97.0]
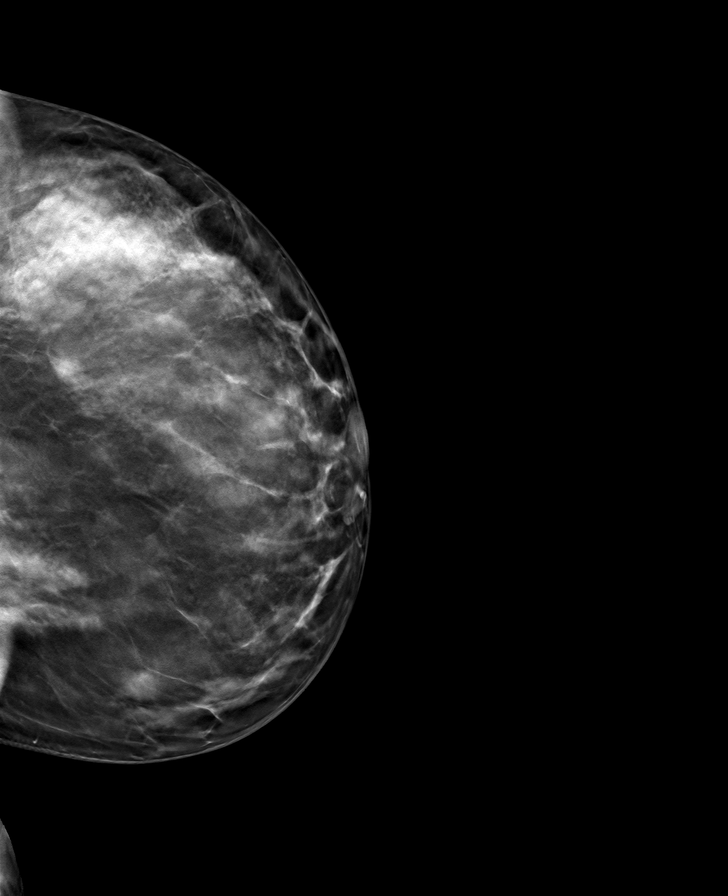

[R CC tomo · tomo slice 46/91.0]
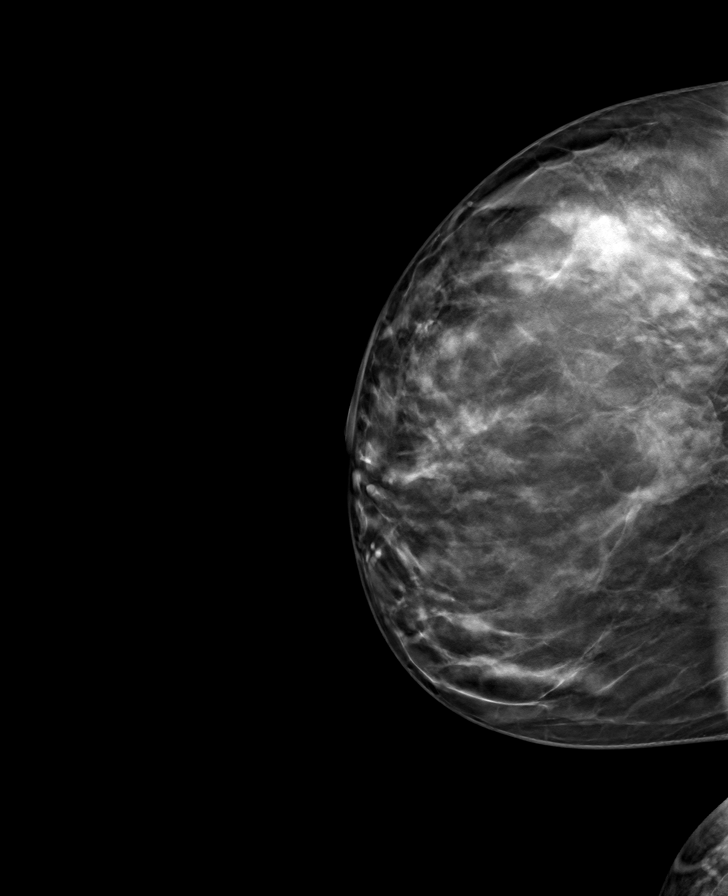

[8 of 24 positions shown; findings below may reference images not displayed]

ACR Breast Density Category c: The breast tissue is heterogeneously
dense, which may obscure small masses.
FINDINGS: There are no findings suspicious for malignancy. Images were
processed with CAD.
IMPRESSION: No mammographic evidence of malignancy. A result letter of this
screening mammogram will be mailed directly to the patient.

RECOMMENDATION:
Screening mammogram in one year. (Code:FT-U-LHB)

BI-RADS CATEGORY  1: Negative.

## 2021-12-12 ENCOUNTER — Ambulatory Visit (INDEPENDENT_AMBULATORY_CARE_PROVIDER_SITE_OTHER): Payer: Self-pay | Admitting: Dermatology

## 2021-12-12 ENCOUNTER — Encounter: Payer: Self-pay | Admitting: Dermatology

## 2021-12-12 DIAGNOSIS — L988 Other specified disorders of the skin and subcutaneous tissue: Secondary | ICD-10-CM

## 2021-12-12 NOTE — Progress Notes (Signed)
? ?  Follow-Up Visit ?  ?Subjective  ?Brittany Hunt is a 46 y.o. female who presents for the following: Facial Elastosis (Here for Botox). ? ?The following portions of the chart were reviewed this encounter and updated as appropriate:  Tobacco  Allergies  Meds  Problems  Med Hx  Surg Hx  Fam Hx   ?  ?Review of Systems: No other skin or systemic complaints except as noted in HPI or Assessment and Plan. ? ?Objective  ?Well appearing patient in no apparent distress; mood and affect are within normal limits. ? ?A focused examination was performed including face. Relevant physical exam findings are noted in the Assessment and Plan. ? ?face ?Rhytides and volume loss.  ? ? ? ? ? ?Assessment & Plan  ?Elastosis of skin ?face ?Botox 45 units injected as noted below:  ?Frown complex 20 units ?Forehead 5 units ?Crow's feet 10 units each side ? ?Botox Injection - face ?Location: See attached image ? ?Informed consent: Discussed risks (infection, pain, bleeding, bruising, swelling, allergic reaction, paralysis of nearby muscles, eyelid droop, double vision, neck weakness, difficulty breathing, headache, undesirable cosmetic result, and need for additional treatment) and benefits of the procedure, as well as the alternatives.  Informed consent was obtained. ? ?Preparation: The area was cleansed with alcohol. ? ?Procedure Details:  Botox was injected into the dermis with a 30-gauge needle. Pressure applied to any bleeding. Ice packs offered for swelling. ? ?Lot Number:  [id number] ?Expiration:  11/2023 ? ?Total Units Injected:  45 ? ?Plan: Patient was instructed to remain upright for 4 hours. Patient was instructed to avoid massaging the face and avoid vigorous exercise for the rest of the day. Tylenol may be used for headache.  Allow 2 weeks before returning to clinic for additional dosing as needed. Patient will call for any problems. ? ?Return for Botox 3-4 months. ? ?I, Lawson Radar, CMA, am acting as scribe for Armida Sans, MD. ?Documentation: I have reviewed the above documentation for accuracy and completeness, and I agree with the above. ? ?Armida Sans, MD ? ? ?

## 2021-12-12 NOTE — Patient Instructions (Signed)

## 2021-12-19 ENCOUNTER — Encounter: Payer: Self-pay | Admitting: Dermatology

## 2021-12-30 DIAGNOSIS — R051 Acute cough: Secondary | ICD-10-CM | POA: Diagnosis not present

## 2021-12-30 DIAGNOSIS — J209 Acute bronchitis, unspecified: Secondary | ICD-10-CM | POA: Diagnosis not present

## 2022-04-10 ENCOUNTER — Ambulatory Visit (INDEPENDENT_AMBULATORY_CARE_PROVIDER_SITE_OTHER): Payer: Self-pay | Admitting: Dermatology

## 2022-04-10 DIAGNOSIS — L988 Other specified disorders of the skin and subcutaneous tissue: Secondary | ICD-10-CM

## 2022-04-10 NOTE — Patient Instructions (Signed)
Due to recent changes in healthcare laws, you may see results of your pathology and/or laboratory studies on MyChart before the doctors have had a chance to review them. We understand that in some cases there may be results that are confusing or concerning to you. Please understand that not all results are received at the same time and often the doctors may need to interpret multiple results in order to provide you with the best plan of care or course of treatment. Therefore, we ask that you please give us 2 business days to thoroughly review all your results before contacting the office for clarification. Should we see a critical lab result, you will be contacted sooner.   If You Need Anything After Your Visit  If you have any questions or concerns for your doctor, please call our main line at [phone] and press option 4 to reach your doctor's medical assistant. If no one answers, please leave a voicemail as directed and we will return your call as soon as possible. Messages left after 4 pm will be answered the following business day.   You may also send us a message via MyChart. We typically respond to MyChart messages within 1-2 business days.  For prescription refills, please ask your pharmacy to contact our office. Our fax number is [phone].  If you have an urgent issue when the clinic is closed that cannot wait until the next business day, you can page your doctor at the number below.    Please note that while we do our best to be available for urgent issues outside of office hours, we are not available 24/7.   If you have an urgent issue and are unable to reach us, you may choose to seek medical care at your doctor's office, retail clinic, urgent care center, or emergency room.  If you have a medical emergency, please immediately call 911 or go to the emergency department.  Pager Numbers  - Dr. Kowalski: [phone]  - Dr. Moye: [phone]  - Dr. Stewart:  [phone]  In the event of inclement weather, please call our main line at [phone] for an update on the status of any delays or closures.  Dermatology Medication Tips: Please keep the boxes that topical medications come in in order to help keep track of the instructions about where and how to use these. Pharmacies typically print the medication instructions only on the boxes and not directly on the medication tubes.   If your medication is too expensive, please contact our office at [phone] option 4 or send us a message through MyChart.   We are unable to tell what your co-pay for medications will be in advance as this is different depending on your insurance coverage. However, we may be able to find a substitute medication at lower cost or fill out paperwork to get insurance to cover a needed medication.   If a prior authorization is required to get your medication covered by your insurance company, please allow us 1-2 business days to complete this process.  Drug prices often vary depending on where the prescription is filled and some pharmacies may offer cheaper prices.  The website www.goodrx.com contains coupons for medications through different pharmacies. The prices here do not account for what the cost may be with help from insurance (it may be cheaper with your insurance), but the website can give you the price if you did not use any insurance.  - You can print the associated coupon and take it with   your prescription to the pharmacy.  - You may also stop by our office during regular business hours and pick up a GoodRx coupon card.  - If you need your prescription sent electronically to a different pharmacy, notify our office through [facility] MyChart or by phone at [phone] option 4.     Si Usted Necesita Algo Despus de Su Visita  Tambin puede enviarnos un mensaje a travs de MyChart. Por lo general respondemos a los mensajes de MyChart en el transcurso de 1 a 2  das hbiles.  Para renovar recetas, por favor pida a su farmacia que se ponga en contacto con nuestra oficina. Nuestro nmero de fax es el [phone].  Si tiene un asunto urgente cuando la clnica est cerrada y que no puede esperar hasta el siguiente da hbil, puede llamar/localizar a su doctor(a) al nmero que aparece a continuacin.   Por favor, tenga en cuenta que aunque hacemos todo lo posible para estar disponibles para asuntos urgentes fuera del horario de oficina, no estamos disponibles las 24 horas del da, los 7 das de la semana.   Si tiene un problema urgente y no puede comunicarse con nosotros, puede optar por buscar atencin mdica  en el consultorio de su doctor(a), en una clnica privada, en un centro de atencin urgente o en una sala de emergencias.  Si tiene una emergencia mdica, por favor llame inmediatamente al 911 o vaya a la sala de emergencias.  Nmeros de bper  - Dr. Kowalski: [phone]  - Dra. Moye: [phone]  - Dra. Stewart: [phone]  En caso de inclemencias del tiempo, por favor llame a nuestra lnea principal al [phone] para una actualizacin sobre el estado de cualquier retraso o cierre.  Consejos para la medicacin en dermatologa: Por favor, guarde las cajas en las que vienen los medicamentos de uso tpico para ayudarle a seguir las instrucciones sobre dnde y cmo usarlos. Las farmacias generalmente imprimen las instrucciones del medicamento slo en las cajas y no directamente en los tubos del medicamento.   Si su medicamento es muy caro, por favor, pngase en contacto con nuestra oficina llamando al [phone] y presione la opcin 4 o envenos un mensaje a travs de MyChart.   No podemos decirle cul ser su copago por los medicamentos por adelantado ya que esto es diferente dependiendo de la cobertura de su seguro. Sin embargo, es posible que podamos encontrar un medicamento sustituto a menor costo o llenar un formulario para que el  seguro cubra el medicamento que se considera necesario.   Si se requiere una autorizacin previa para que su compaa de seguros cubra su medicamento, por favor permtanos de 1 a 2 das hbiles para completar este proceso.  Los precios de los medicamentos varan con frecuencia dependiendo del lugar de dnde se surte la receta y alguna farmacias pueden ofrecer precios ms baratos.  El sitio web www.goodrx.com tiene cupones para medicamentos de diferentes farmacias. Los precios aqu no tienen en cuenta lo que podra costar con la ayuda del seguro (puede ser ms barato con su seguro), pero el sitio web puede darle el precio si no utiliz ningn seguro.  - Puede imprimir el cupn correspondiente y llevarlo con su receta a la farmacia.  - Tambin puede pasar por nuestra oficina durante el horario de atencin regular y recoger una tarjeta de cupones de GoodRx.  - Si necesita que su receta se enve electrnicamente a una farmacia diferente, informe a nuestra oficina a travs de MyChart de [facility]   o por telfono llamando al [phone] y presione la opcin 4.  

## 2022-04-10 NOTE — Progress Notes (Signed)
   Follow-Up Visit   Subjective  Brittany Hunt is a 46 y.o. female who presents for the following: Facial Elastosis (Face, pt presents for Botox today).  The following portions of the chart were reviewed this encounter and updated as appropriate:   Tobacco  Allergies  Meds  Problems  Med Hx  Surg Hx  Fam Hx     Review of Systems:  No other skin or systemic complaints except as noted in HPI or Assessment and Plan.  Objective  Well appearing patient in no apparent distress; mood and affect are within normal limits.  A focused examination was performed including face. Relevant physical exam findings are noted in the Assessment and Plan.  Face Rhytides and volume loss.       Assessment & Plan  Elastosis of skin Face  Botox 45 units injected as noted below:  Frown complex 20 units Forehead 5 units Crow's feet 10 units each side  Botox Injection - Face Location: frown complex, forehead, crow's feet  Informed consent: Discussed risks (infection, pain, bleeding, bruising, swelling, allergic reaction, paralysis of nearby muscles, eyelid droop, double vision, neck weakness, difficulty breathing, headache, undesirable cosmetic result, and need for additional treatment) and benefits of the procedure, as well as the alternatives.  Informed consent was obtained.  Preparation: The area was cleansed with alcohol.  Procedure Details:  Botox was injected into the dermis with a 30-gauge needle. Pressure applied to any bleeding. Ice packs offered for swelling.  Lot Number:  [id number] Expiration:  06/2024  Total Units Injected:  45  Plan: Patient was instructed to remain upright for 4 hours. Patient was instructed to avoid massaging the face and avoid vigorous exercise for the rest of the day. Tylenol may be used for headache.  Allow 2 weeks before returning to clinic for additional dosing as needed. Patient will call for any problems.    Return for 3-66m Botox.  I, Ardis Rowan, RMA, am acting as scribe for Armida Sans, MD . Documentation: I have reviewed the above documentation for accuracy and completeness, and I agree with the above.  Armida Sans, MD

## 2022-04-13 ENCOUNTER — Encounter: Payer: Self-pay | Admitting: Dermatology

## 2022-07-05 ENCOUNTER — Other Ambulatory Visit: Payer: Self-pay | Admitting: Obstetrics and Gynecology

## 2022-07-05 DIAGNOSIS — Z0000 Encounter for general adult medical examination without abnormal findings: Secondary | ICD-10-CM | POA: Diagnosis not present

## 2022-07-05 DIAGNOSIS — Z1231 Encounter for screening mammogram for malignant neoplasm of breast: Secondary | ICD-10-CM

## 2022-07-05 DIAGNOSIS — F419 Anxiety disorder, unspecified: Secondary | ICD-10-CM | POA: Diagnosis not present

## 2022-07-05 DIAGNOSIS — E7800 Pure hypercholesterolemia, unspecified: Secondary | ICD-10-CM | POA: Diagnosis not present

## 2022-07-17 ENCOUNTER — Ambulatory Visit: Payer: BC Managed Care – PPO | Admitting: Dermatology

## 2022-08-07 ENCOUNTER — Ambulatory Visit (INDEPENDENT_AMBULATORY_CARE_PROVIDER_SITE_OTHER): Payer: BC Managed Care – PPO | Admitting: Dermatology

## 2022-08-07 VITALS — BP 118/77 | HR 93

## 2022-08-07 DIAGNOSIS — L988 Other specified disorders of the skin and subcutaneous tissue: Secondary | ICD-10-CM

## 2022-08-07 NOTE — Patient Instructions (Signed)
Due to recent changes in healthcare laws, you may see results of your pathology and/or laboratory studies on MyChart before the doctors have had a chance to review them. We understand that in some cases there may be results that are confusing or concerning to you. Please understand that not all results are received at the same time and often the doctors may need to interpret multiple results in order to provide you with the best plan of care or course of treatment. Therefore, we ask that you please give us 2 business days to thoroughly review all your results before contacting the office for clarification. Should we see a critical lab result, you will be contacted sooner.   If You Need Anything After Your Visit  If you have any questions or concerns for your doctor, please call our main line at [phone] and press option 4 to reach your doctor's medical assistant. If no one answers, please leave a voicemail as directed and we will return your call as soon as possible. Messages left after 4 pm will be answered the following business day.   You may also send us a message via MyChart. We typically respond to MyChart messages within 1-2 business days.  For prescription refills, please ask your pharmacy to contact our office. Our fax number is [phone].  If you have an urgent issue when the clinic is closed that cannot wait until the next business day, you can page your doctor at the number below.    Please note that while we do our best to be available for urgent issues outside of office hours, we are not available 24/7.   If you have an urgent issue and are unable to reach us, you may choose to seek medical care at your doctor's office, retail clinic, urgent care center, or emergency room.  If you have a medical emergency, please immediately call 911 or go to the emergency department.  Pager Numbers  - Dr. Kowalski: [phone]  - Dr. Moye: [phone]  - Dr. Stewart:  [phone]  In the event of inclement weather, please call our main line at [phone] for an update on the status of any delays or closures.  Dermatology Medication Tips: Please keep the boxes that topical medications come in in order to help keep track of the instructions about where and how to use these. Pharmacies typically print the medication instructions only on the boxes and not directly on the medication tubes.   If your medication is too expensive, please contact our office at [phone] option 4 or send us a message through MyChart.   We are unable to tell what your co-pay for medications will be in advance as this is different depending on your insurance coverage. However, we may be able to find a substitute medication at lower cost or fill out paperwork to get insurance to cover a needed medication.   If a prior authorization is required to get your medication covered by your insurance company, please allow us 1-2 business days to complete this process.  Drug prices often vary depending on where the prescription is filled and some pharmacies may offer cheaper prices.  The website www.goodrx.com contains coupons for medications through different pharmacies. The prices here do not account for what the cost may be with help from insurance (it may be cheaper with your insurance), but the website can give you the price if you did not use any insurance.  - You can print the associated coupon and take it with   your prescription to the pharmacy.  - You may also stop by our office during regular business hours and pick up a GoodRx coupon card.  - If you need your prescription sent electronically to a different pharmacy, notify our office through [facility] MyChart or by phone at [phone] option 4.     Si Usted Necesita Algo Despus de Su Visita  Tambin puede enviarnos un mensaje a travs de MyChart. Por lo general respondemos a los mensajes de MyChart en el transcurso de 1 a 2  das hbiles.  Para renovar recetas, por favor pida a su farmacia que se ponga en contacto con nuestra oficina. Nuestro nmero de fax es el [phone].  Si tiene un asunto urgente cuando la clnica est cerrada y que no puede esperar hasta el siguiente da hbil, puede llamar/localizar a su doctor(a) al nmero que aparece a continuacin.   Por favor, tenga en cuenta que aunque hacemos todo lo posible para estar disponibles para asuntos urgentes fuera del horario de oficina, no estamos disponibles las 24 horas del da, los 7 das de la semana.   Si tiene un problema urgente y no puede comunicarse con nosotros, puede optar por buscar atencin mdica  en el consultorio de su doctor(a), en una clnica privada, en un centro de atencin urgente o en una sala de emergencias.  Si tiene una emergencia mdica, por favor llame inmediatamente al 911 o vaya a la sala de emergencias.  Nmeros de bper  - Dr. Kowalski: [phone]  - Dra. Moye: [phone]  - Dra. Stewart: [phone]  En caso de inclemencias del tiempo, por favor llame a nuestra lnea principal al [phone] para una actualizacin sobre el estado de cualquier retraso o cierre.  Consejos para la medicacin en dermatologa: Por favor, guarde las cajas en las que vienen los medicamentos de uso tpico para ayudarle a seguir las instrucciones sobre dnde y cmo usarlos. Las farmacias generalmente imprimen las instrucciones del medicamento slo en las cajas y no directamente en los tubos del medicamento.   Si su medicamento es muy caro, por favor, pngase en contacto con nuestra oficina llamando al [phone] y presione la opcin 4 o envenos un mensaje a travs de MyChart.   No podemos decirle cul ser su copago por los medicamentos por adelantado ya que esto es diferente dependiendo de la cobertura de su seguro. Sin embargo, es posible que podamos encontrar un medicamento sustituto a menor costo o llenar un formulario para que el  seguro cubra el medicamento que se considera necesario.   Si se requiere una autorizacin previa para que su compaa de seguros cubra su medicamento, por favor permtanos de 1 a 2 das hbiles para completar este proceso.  Los precios de los medicamentos varan con frecuencia dependiendo del lugar de dnde se surte la receta y alguna farmacias pueden ofrecer precios ms baratos.  El sitio web www.goodrx.com tiene cupones para medicamentos de diferentes farmacias. Los precios aqu no tienen en cuenta lo que podra costar con la ayuda del seguro (puede ser ms barato con su seguro), pero el sitio web puede darle el precio si no utiliz ningn seguro.  - Puede imprimir el cupn correspondiente y llevarlo con su receta a la farmacia.  - Tambin puede pasar por nuestra oficina durante el horario de atencin regular y recoger una tarjeta de cupones de GoodRx.  - Si necesita que su receta se enve electrnicamente a una farmacia diferente, informe a nuestra oficina a travs de MyChart de [facility]   o por telfono llamando al [phone] y presione la opcin 4.  

## 2022-08-07 NOTE — Progress Notes (Signed)
   Follow-Up Visit   Subjective  Brittany Hunt is a 46 y.o. female who presents for the following: Facial Elastosis (Face, pt presents for botox).  The following portions of the chart were reviewed this encounter and updated as appropriate:   Tobacco  Allergies  Meds  Problems  Med Hx  Surg Hx  Fam Hx     Review of Systems:  No other skin or systemic complaints except as noted in HPI or Assessment and Plan.  Objective  Well appearing patient in no apparent distress; mood and affect are within normal limits.  A focused examination was performed including face. Relevant physical exam findings are noted in the Assessment and Plan.  face Rhytides and volume loss.       Assessment & Plan  Elastosis of skin face  Botox 45 units injected as noted below:  Frown complex 20 units Forehead 5 units Crow's feet 10 units each side  Botox Injection - face Location: frown complex, forehead, crows feet  Informed consent: Discussed risks (infection, pain, bleeding, bruising, swelling, allergic reaction, paralysis of nearby muscles, eyelid droop, double vision, neck weakness, difficulty breathing, headache, undesirable cosmetic result, and need for additional treatment) and benefits of the procedure, as well as the alternatives.  Informed consent was obtained.  Preparation: The area was cleansed with alcohol.  Procedure Details:  Botox was injected into the dermis with a 30-gauge needle. Pressure applied to any bleeding. Ice packs offered for swelling.  Lot Number:  [id number] Expiration:  09/2024  Total Units Injected:  45  Plan: Patient was instructed to remain upright for 4 hours. Patient was instructed to avoid massaging the face and avoid vigorous exercise for the rest of the day. Tylenol may be used for headache.  Allow 2 weeks before returning to clinic for additional dosing as needed. Patient will call for any problems.    Return for 3-60m Botox.  I, Ardis Rowan,  RMA, am acting as scribe for Armida Sans, MD . Documentation: I have reviewed the above documentation for accuracy and completeness, and I agree with the above.  Armida Sans, MD

## 2022-08-17 ENCOUNTER — Encounter: Payer: Self-pay | Admitting: Dermatology

## 2022-09-05 ENCOUNTER — Ambulatory Visit
Admission: RE | Admit: 2022-09-05 | Discharge: 2022-09-05 | Disposition: A | Discharge status: Home / Self Care | Payer: BC Managed Care – PPO | Source: Ambulatory Visit | Attending: Obstetrics and Gynecology | Admitting: Obstetrics and Gynecology

## 2022-09-05 DIAGNOSIS — Z1231 Encounter for screening mammogram for malignant neoplasm of breast: Secondary | ICD-10-CM

## 2022-10-19 ENCOUNTER — Ambulatory Visit
Admission: EM | Admit: 2022-10-19 | Discharge: 2022-10-19 | Disposition: A | Discharge status: Home / Self Care | Payer: BC Managed Care – PPO | Attending: Urgent Care | Admitting: Urgent Care

## 2022-10-19 DIAGNOSIS — B9689 Other specified bacterial agents as the cause of diseases classified elsewhere: Secondary | ICD-10-CM

## 2022-10-19 DIAGNOSIS — J019 Acute sinusitis, unspecified: Secondary | ICD-10-CM | POA: Diagnosis not present

## 2022-10-19 MED ORDER — AMOXICILLIN-POT CLAVULANATE 875-125 MG PO TABS: 1.0000 | ORAL_TABLET | Freq: Two times a day (BID) | ORAL | 0 refills | Status: AC

## 2022-10-19 MED ORDER — PREDNISONE 20 MG PO TABS: ORAL_TABLET | ORAL | 0 refills | Status: AC

## 2022-10-19 NOTE — Discharge Instructions (Signed)
Follow up here or with your primary care provider if your symptoms are worsening or not improving with treatment.          

## 2022-10-19 NOTE — ED Provider Notes (Signed)
Roderic Palau    CSN: [id number] Arrival date & time: 10/19/22  1114      History   Chief Complaint Chief Complaint  Patient presents with   Sore Throat    Swollen lump on left side of neck    HPI Brittany Hunt is a 47 y.o. female.    Sore Throat    Presents with complaint of sore throat, drainage "down my throat", ear pain x nearly 3weeks.  She endorses swollen lymph node or lump on the left side of her neck (left) starting 3 days ago. Also reports hoarse voice.  Past Medical History:  Diagnosis Date   Anxiety    Elevated ALT measurement 2019   Endometriosis 2004   Hyperlipidemia 2016   Infertility, female 2005   INVETRO @ [facility]    There are no problems to display for this patient.   Past Surgical History:  Procedure Laterality Date   CESAREAN SECTION  2005   [facility]   PELVIC LAPAROSCOPY  2004    OB History     Gravida  1   Para  1   Term  0   Preterm  1   AB  0   Living  2      SAB  0   IAB  0   Ectopic  0   Multiple  1   Live Births  2            Home Medications    Prior to Admission medications   Medication Sig Start Date End Date Taking? Authorizing Provider  ALPRAZolam (XANAX) 0.25 MG tablet Take 0.25 mg by mouth at bedtime.  11/29/14  Yes [provider]  fenofibrate (TRICOR) 145 MG tablet Take 1 tablet by mouth daily. 05/05/17  Yes [provider]  Melatonin 10 MG CAPS Take 1 capsule by mouth at bedtime.   Yes [provider]  omega-3 acid ethyl esters (LOVAZA) 1 g capsule Take 1 g by mouth daily.   Yes [provider]  cephALEXin (KEFLEX) 500 MG capsule Take 500 mg by mouth 2 (two) times daily. 04/13/21   [provider]  norethindrone-ethinyl estradiol-FE (JUNEL FE 1/20) 1-20 MG-MCG tablet Take 1 tablet by mouth daily. 08/02/21   Salvadore Dom, MD    Family History Family History  Problem Relation Age of Onset   Diabetes Mother    Thyroid disease  Mother    Thyroid disease Sister    Cancer Sister        Dec Lung Ca age 95   Heart failure Maternal Grandmother    Heart disease Paternal Grandmother    Heart attack Sister 31       had pacemaker placed   Diabetes Sister 30       Insulin dependent   Breast cancer Neg Hx     Social History Social History   Tobacco Use   Smoking status: Never   Smokeless tobacco: Never  Vaping Use   Vaping Use: Never used  Substance Use Topics   Alcohol use: Yes    Alcohol/week: 1.0 standard drink of alcohol    Types: 1 Glasses of wine per week   Drug use: No     Allergies   Codeine   Review of Systems Review of Systems   Physical Exam Triage Vital Signs ED Triage Vitals  Enc Vitals Group     BP 10/19/22 1208 (!) 136/93     Pulse Rate 10/19/22 1208 95  Resp 10/19/22 1208 18     Temp 10/19/22 1208 98.2 F (36.8 C)     Temp Source 10/19/22 1208 Oral     SpO2 10/19/22 1208 97 %     Weight --      Height --      Head Circumference --      Peak Flow --      Pain Score 10/19/22 1214 0     Pain Loc --      Pain Edu? --      Excl. in [place]? --    No data found.  Updated Vital Signs BP (!) 136/93 (BP Location: Left Arm)   Pulse 95   Temp 98.2 F (36.8 C) (Oral)   Resp 18   LMP 09/04/2022 (Approximate) Comment: irregular periods  SpO2 97%   Visual Acuity Right Eye Distance:   Left Eye Distance:   Bilateral Distance:    Right Eye Near:   Left Eye Near:    Bilateral Near:     Physical Exam Vitals reviewed.  Constitutional:      Appearance: She is well-developed. She is ill-appearing.  HENT:     Nose:     Right Sinus: Maxillary sinus tenderness present.     Left Sinus: Maxillary sinus tenderness present.  Abdominal:     General: Bowel sounds are normal.     Palpations: Abdomen is soft.  Skin:    General: Skin is warm and dry.  Neurological:     General: No focal deficit present.     Mental Status: She is alert and oriented to person, place, and time.   Psychiatric:        Mood and Affect: Mood normal.        Behavior: Behavior normal.      UC Treatments / Results  Labs (all labs ordered are listed, but only abnormal results are displayed) Labs Reviewed - No data to display  EKG   Radiology No results found.  Procedures Procedures (including critical care time)  Medications Ordered in UC Medications - No data to display  Initial Impression / Assessment and Plan / UC Course  I have reviewed the triage vital signs and the nursing notes.  Pertinent labs & imaging results that were available during my care of the patient were reviewed by me and considered in my medical decision making (see chart for details).   Patient is afebrile here without recent antipyretics. Satting well on room air. Overall is well appearing, well hydrated, without respiratory distress.  Maxillary sinus tenderness bilaterally.  Given duration of symptoms, suspicious for acute bacterial sinusitis likely secondary to a viral infection.  Will treat with Augmentin x 7 days.  Also adding a course of prednisone to speed resolution of her symptoms of sinusitis including ear pain 2/2 eustachian tube dysfunction.    Final Clinical Impressions(s) / UC Diagnoses   Final diagnoses:  None   Discharge Instructions   None    ED Prescriptions   None    PDMP not reviewed this encounter.   Rose Phi, [place] 10/19/22 1230

## 2022-10-19 NOTE — ED Triage Notes (Signed)
Sore throat, drainage down throat, ear pain that has been around for 2 weeks. Having swollen lymph node/ lump on side of left neck, that started 3 days ago. Taking Advil.

## 2022-11-13 DIAGNOSIS — E7800 Pure hypercholesterolemia, unspecified: Secondary | ICD-10-CM | POA: Diagnosis not present

## 2022-11-27 ENCOUNTER — Ambulatory Visit (INDEPENDENT_AMBULATORY_CARE_PROVIDER_SITE_OTHER): Payer: Self-pay | Admitting: Dermatology

## 2022-11-27 VITALS — BP 129/91

## 2022-11-27 DIAGNOSIS — L988 Other specified disorders of the skin and subcutaneous tissue: Secondary | ICD-10-CM

## 2022-11-27 NOTE — Patient Instructions (Signed)
Due to recent changes in healthcare laws, you may see results of your pathology and/or laboratory studies on MyChart before the doctors have had a chance to review them. We understand that in some cases there may be results that are confusing or concerning to you. Please understand that not all results are received at the same time and often the doctors may need to interpret multiple results in order to provide you with the best plan of care or course of treatment. Therefore, we ask that you please give us 2 business days to thoroughly review all your results before contacting the office for clarification. Should we see a critical lab result, you will be contacted sooner.   If You Need Anything After Your Visit  If you have any questions or concerns for your doctor, please call our main line at [phone] and press option 4 to reach your doctor's medical assistant. If no one answers, please leave a voicemail as directed and we will return your call as soon as possible. Messages left after 4 pm will be answered the following business day.   You may also send us a message via MyChart. We typically respond to MyChart messages within 1-2 business days.  For prescription refills, please ask your pharmacy to contact our office. Our fax number is [phone].  If you have an urgent issue when the clinic is closed that cannot wait until the next business day, you can page your doctor at the number below.    Please note that while we do our best to be available for urgent issues outside of office hours, we are not available 24/7.   If you have an urgent issue and are unable to reach us, you may choose to seek medical care at your doctor's office, retail clinic, urgent care center, or emergency room.  If you have a medical emergency, please immediately call 911 or go to the emergency department.  Pager Numbers  - Dr. Kowalski: [phone]  - Dr. Moye: [phone]  - Dr. Stewart:  [phone]  In the event of inclement weather, please call our main line at [phone] for an update on the status of any delays or closures.  Dermatology Medication Tips: Please keep the boxes that topical medications come in in order to help keep track of the instructions about where and how to use these. Pharmacies typically print the medication instructions only on the boxes and not directly on the medication tubes.   If your medication is too expensive, please contact our office at [phone] option 4 or send us a message through MyChart.   We are unable to tell what your co-pay for medications will be in advance as this is different depending on your insurance coverage. However, we may be able to find a substitute medication at lower cost or fill out paperwork to get insurance to cover a needed medication.   If a prior authorization is required to get your medication covered by your insurance company, please allow us 1-2 business days to complete this process.  Drug prices often vary depending on where the prescription is filled and some pharmacies may offer cheaper prices.  The website www.goodrx.com contains coupons for medications through different pharmacies. The prices here do not account for what the cost may be with help from insurance (it may be cheaper with your insurance), but the website can give you the price if you did not use any insurance.  - You can print the associated coupon and take it with   your prescription to the pharmacy.  - You may also stop by our office during regular business hours and pick up a GoodRx coupon card.  - If you need your prescription sent electronically to a different pharmacy, notify our office through [facility] MyChart or by phone at [phone] option 4.     Si Usted Necesita Algo Despus de Su Visita  Tambin puede enviarnos un mensaje a travs de MyChart. Por lo general respondemos a los mensajes de MyChart en el transcurso de 1 a 2  das hbiles.  Para renovar recetas, por favor pida a su farmacia que se ponga en contacto con nuestra oficina. Nuestro nmero de fax es el [phone].  Si tiene un asunto urgente cuando la clnica est cerrada y que no puede esperar hasta el siguiente da hbil, puede llamar/localizar a su doctor(a) al nmero que aparece a continuacin.   Por favor, tenga en cuenta que aunque hacemos todo lo posible para estar disponibles para asuntos urgentes fuera del horario de oficina, no estamos disponibles las 24 horas del da, los 7 das de la semana.   Si tiene un problema urgente y no puede comunicarse con nosotros, puede optar por buscar atencin mdica  en el consultorio de su doctor(a), en una clnica privada, en un centro de atencin urgente o en una sala de emergencias.  Si tiene una emergencia mdica, por favor llame inmediatamente al 911 o vaya a la sala de emergencias.  Nmeros de bper  - Dr. Kowalski: [phone]  - Dra. Moye: [phone]  - Dra. Stewart: [phone]  En caso de inclemencias del tiempo, por favor llame a nuestra lnea principal al [phone] para una actualizacin sobre el estado de cualquier retraso o cierre.  Consejos para la medicacin en dermatologa: Por favor, guarde las cajas en las que vienen los medicamentos de uso tpico para ayudarle a seguir las instrucciones sobre dnde y cmo usarlos. Las farmacias generalmente imprimen las instrucciones del medicamento slo en las cajas y no directamente en los tubos del medicamento.   Si su medicamento es muy caro, por favor, pngase en contacto con nuestra oficina llamando al [phone] y presione la opcin 4 o envenos un mensaje a travs de MyChart.   No podemos decirle cul ser su copago por los medicamentos por adelantado ya que esto es diferente dependiendo de la cobertura de su seguro. Sin embargo, es posible que podamos encontrar un medicamento sustituto a menor costo o llenar un formulario para que el  seguro cubra el medicamento que se considera necesario.   Si se requiere una autorizacin previa para que su compaa de seguros cubra su medicamento, por favor permtanos de 1 a 2 das hbiles para completar este proceso.  Los precios de los medicamentos varan con frecuencia dependiendo del lugar de dnde se surte la receta y alguna farmacias pueden ofrecer precios ms baratos.  El sitio web www.goodrx.com tiene cupones para medicamentos de diferentes farmacias. Los precios aqu no tienen en cuenta lo que podra costar con la ayuda del seguro (puede ser ms barato con su seguro), pero el sitio web puede darle el precio si no utiliz ningn seguro.  - Puede imprimir el cupn correspondiente y llevarlo con su receta a la farmacia.  - Tambin puede pasar por nuestra oficina durante el horario de atencin regular y recoger una tarjeta de cupones de GoodRx.  - Si necesita que su receta se enve electrnicamente a una farmacia diferente, informe a nuestra oficina a travs de MyChart de [facility]   o por telfono llamando al [phone] y presione la opcin 4.  

## 2022-11-27 NOTE — Progress Notes (Signed)
   Follow-Up Visit   Subjective  Brittany Hunt is a 47 y.o. female who presents for the following: Botox for facial elastosis, pt feels like botox is wearing off at 3 months in glabella area.  The following portions of the chart were reviewed this encounter and updated as appropriate: medications, allergies, medical history  Review of Systems:  No other skin or systemic complaints except as noted in HPI or Assessment and Plan.  Objective  Well appearing patient in no apparent distress; mood and affect are within normal limits.  A focused examination was performed of the face.  Relevant physical exam findings are noted in the Assessment and Plan.  Injection map photo     Assessment & Plan    Facial Elastosis  Location: frown complex, forehead, crow's feet Botox 45 units injected today to: - Frown complex 20 units - Forehead 5 units - Crow's feet 10 units x 2  Discussed pt coming every 3 months or increasing the units in the glabella.  Informed consent: Discussed risks (infection, pain, bleeding, bruising, swelling, allergic reaction, paralysis of nearby muscles, eyelid droop, double vision, neck weakness, difficulty breathing, headache, undesirable cosmetic result, and need for additional treatment) and benefits of the procedure, as well as the alternatives.  Informed consent was obtained.  Preparation: The area was cleansed with alcohol.  Procedure Details:  Botox was injected into the dermis with a 30-gauge needle. Pressure applied to any bleeding. Ice packs offered for swelling.  Lot Number:  [id number] Expiration:  03/26  Total Units Injected:  45  Plan: Tylenol may be used for headache.  Allow 2 weeks before returning to clinic for additional dosing as needed. Patient will call for any problems.  Return for 3 month Botox.  I, Ardis Rowan, RMA, am acting as scribe for Armida Sans, MD .   Documentation: I have reviewed the above documentation for accuracy  and completeness, and I agree with the above.  Armida Sans, MD

## 2022-12-10 ENCOUNTER — Encounter: Payer: Self-pay | Admitting: Dermatology

## 2023-01-08 ENCOUNTER — Ambulatory Visit
Admission: RE | Admit: 2023-01-08 | Discharge: 2023-01-08 | Disposition: A | Discharge status: Home / Self Care | Payer: BC Managed Care – PPO | Source: Ambulatory Visit | Attending: Urgent Care | Admitting: Urgent Care

## 2023-01-08 VITALS — BP 127/87 | HR 89 | Temp 98.6°F | Resp 16

## 2023-01-08 DIAGNOSIS — M461 Sacroiliitis, not elsewhere classified: Secondary | ICD-10-CM

## 2023-01-08 LAB — POCT URINALYSIS DIP (MANUAL ENTRY)
Bilirubin, UA: NEGATIVE
Blood, UA: NEGATIVE
Glucose, UA: NEGATIVE mg/dL
Ketones, POC UA: NEGATIVE mg/dL
Leukocytes, UA: NEGATIVE
Nitrite, UA: NEGATIVE
Protein Ur, POC: NEGATIVE mg/dL
Spec Grav, UA: 1.015 (ref 1.010–1.025)
Urobilinogen, UA: 0.2 E.U./dL
pH, UA: 7 (ref 5.0–8.0)

## 2023-01-08 MED ORDER — PREDNISONE 10 MG (21) PO TBPK: ORAL_TABLET | Freq: Every day | ORAL | 0 refills | Status: AC

## 2023-01-08 MED ORDER — CYCLOBENZAPRINE HCL 5 MG PO TABS: 5.0000 mg | ORAL_TABLET | Freq: Three times a day (TID) | ORAL | 0 refills | Status: AC | PRN

## 2023-01-08 NOTE — Discharge Instructions (Addendum)
Recommend evaluation by orthopedic provider if symptoms are not relieved with treatment or if symptoms recur.Marland Kitchen

## 2023-01-08 NOTE — ED Triage Notes (Signed)
Patient presents to UC for back pain x 3 days. Moving furniture over the weekend.  Reports lower back/hip pain. Wants to rule out infection or possible pinched nerve. Treating with advil. Relief with heat and cold compresses.

## 2023-01-08 NOTE — ED Provider Notes (Addendum)
Brittany Hunt    CSN: [id number] Arrival date & time: 01/08/23  1250      History   Chief Complaint Chief Complaint  Patient presents with   Back Pain    Entered by patient    HPI Brittany Hunt is a 47 y.o. female.    Back Pain   Patient presents to urgent care with complaint of back pain x 3 days.  Patient states she was moving furniture over the weekend and reports lower back and hip pain.  She wants to rule out infection from UTI.  Review of her chart shows possible previous occurrences of lower back pain vs symptom associated with UTI.  Pain is right-sided and centered over the SI joint.  Denies any radiculopathy.  Denies saddle paresthesias.  Past Medical History:  Diagnosis Date   Anxiety    Elevated ALT measurement 2019   Endometriosis 2004   Hyperlipidemia 2016   Infertility, female 2005   INVETRO @ [facility]    There are no problems to display for this patient.   Past Surgical History:  Procedure Laterality Date   CESAREAN SECTION  2005   [facility]   PELVIC LAPAROSCOPY  2004    OB History     Gravida  1   Para  1   Term  0   Preterm  1   AB  0   Living  2      SAB  0   IAB  0   Ectopic  0   Multiple  1   Live Births  2            Home Medications    Prior to Admission medications   Medication Sig Start Date End Date Taking? Authorizing Provider  ALPRAZolam (XANAX) 0.25 MG tablet Take 0.25 mg by mouth at bedtime.  11/29/14   [provider]  amoxicillin-clavulanate (AUGMENTIN) 875-125 MG tablet Take 1 tablet by mouth every 12 (twelve) hours. 10/19/22   Carloyn Lahue, Jeannett Senior, FNP  fenofibrate (TRICOR) 145 MG tablet Take 1 tablet by mouth daily. 05/05/17   [provider]  Melatonin 10 MG CAPS Take 1 capsule by mouth at bedtime.    [provider]  norethindrone-ethinyl estradiol-FE (JUNEL FE 1/20) 1-20 MG-MCG tablet Take 1 tablet by mouth daily. 08/02/21   Romualdo Bolk, MD  omega-3  acid ethyl esters (LOVAZA) 1 g capsule Take 1 g by mouth daily.    [provider]    Family History Family History  Problem Relation Age of Onset   Diabetes Mother    Thyroid disease Mother    Thyroid disease Sister    Cancer Sister        Dec Lung Ca age 67   Heart failure Maternal Grandmother    Heart disease Paternal Grandmother    Heart attack Sister 71       had pacemaker placed   Diabetes Sister 44       Insulin dependent   Breast cancer Neg Hx     Social History Social History   Tobacco Use   Smoking status: Never   Smokeless tobacco: Never  Vaping Use   Vaping Use: Never used  Substance Use Topics   Alcohol use: Yes    Alcohol/week: 1.0 standard drink of alcohol    Types: 1 Glasses of wine per week   Drug use: No     Allergies   Codeine   Review of Systems Review of Systems  Musculoskeletal:  Positive for back pain.     Physical Exam Triage Vital Signs ED Triage Vitals  Enc Vitals Group     BP      Pulse      Resp      Temp      Temp src      SpO2      Weight      Height      Head Circumference      Peak Flow      Pain Score      Pain Loc      Pain Edu?      Excl. in GC?    No data found.  Updated Vital Signs There were no vitals taken for this visit.  Visual Acuity Right Eye Distance:   Left Eye Distance:   Bilateral Distance:    Right Eye Near:   Left Eye Near:    Bilateral Near:     Physical Exam Vitals reviewed.  Constitutional:      Appearance: Normal appearance.  Musculoskeletal:     Lumbar back: Spasms and tenderness present. Decreased range of motion.  Neurological:     Mental Status: She is alert.      UC Treatments / Results  Labs (all labs ordered are listed, but only abnormal results are displayed) Labs Reviewed - No data to display  EKG   Radiology No results found.  Procedures Procedures (including critical care time)  Medications Ordered in UC Medications - No data to  display  Initial Impression / Assessment and Plan / UC Course  I have reviewed the triage vital signs and the nursing notes.  Pertinent labs & imaging results that were available during my care of the patient were reviewed by me and considered in my medical decision making (see chart for details).   UA result is nonsuggestive of UTI.  Suspect right side sacroiliitis triggered by recent physical activity.  Recommended core strengthening and careful posture while lifting heavy items.  Also suggested orthopedic evaluation if symptoms are not relieved by treatment or if they recur.  Treating today with prednisone taper as well as Flexeril to relieve likely muscle spasms and to aid in sleep.  Reviewed chart history.   Counseled patient on potential for adverse effects with medications prescribed/recommended today, ER and return-to-clinic precautions discussed, patient verbalized understanding and agreement with care plan.  Final Clinical Impressions(s) / UC Diagnoses   Final diagnoses:  None   Discharge Instructions   None    ED Prescriptions   None    PDMP not reviewed this encounter.   Charma Igo, FNP 01/08/23 1346    ImmordinoJeannett Senior, [place] 01/08/23 1347

## 2023-01-17 ENCOUNTER — Ambulatory Visit
Admission: RE | Admit: 2023-01-17 | Discharge: 2023-01-17 | Disposition: A | Discharge status: Home / Self Care | Payer: BC Managed Care – PPO | Source: Ambulatory Visit | Attending: Physician Assistant | Admitting: Physician Assistant

## 2023-01-17 VITALS — BP 134/91 | HR 91 | Temp 98.1°F | Resp 18

## 2023-01-17 DIAGNOSIS — T148XXA Other injury of unspecified body region, initial encounter: Secondary | ICD-10-CM

## 2023-01-17 DIAGNOSIS — S00229A Blister (nonthermal) of unspecified eyelid and periocular area, initial encounter: Secondary | ICD-10-CM | POA: Diagnosis not present

## 2023-01-17 NOTE — Discharge Instructions (Signed)
Follow up here or with your primary care provider if your symptoms are worsening or not improving.     

## 2023-01-17 NOTE — ED Triage Notes (Signed)
Patient presents to UC for wound to left upper eye lid since yesterday. Concerned with possible eye infection. They were outdoors they were burning brush.
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
## 2023-02-26 ENCOUNTER — Ambulatory Visit (INDEPENDENT_AMBULATORY_CARE_PROVIDER_SITE_OTHER): Payer: Self-pay | Admitting: Dermatology

## 2023-02-26 DIAGNOSIS — L988 Other specified disorders of the skin and subcutaneous tissue: Secondary | ICD-10-CM

## 2023-02-26 NOTE — Progress Notes (Unsigned)
   Follow-Up Visit   Subjective  Brittany Hunt is a 47 y.o. female who presents for the following: Botox for facial elastosis  The following portions of the chart were reviewed this encounter and updated as appropriate: medications, allergies, medical history  Review of Systems:  No other skin or systemic complaints except as noted in HPI or Assessment and Plan.  Objective  Well appearing patient in no apparent distress; mood and affect are within normal limits.  A focused examination was performed of the face.  Relevant physical exam findings are noted in the Assessment and Plan.     Assessment & Plan    Facial Elastosis  Botox 45 units injected today to: - Frown complex 20 units - Forehead 5 units - Crow's feet 10 units x 2  Location: See attached image  Informed consent: Discussed risks (infection, pain, bleeding, bruising, swelling, allergic reaction, paralysis of nearby muscles, eyelid droop, double vision, neck weakness, difficulty breathing, headache, undesirable cosmetic result, and need for additional treatment) and benefits of the procedure, as well as the alternatives.  Informed consent was obtained.  Preparation: The area was cleansed with alcohol.  Procedure Details:  Botox was injected into the dermis with a 30-gauge needle. Pressure applied to any bleeding. Ice packs offered for swelling.  Lot Number:  [id number] Expiration:  06/26  Total Units Injected:  45  Plan: Tylenol may be used for headache.  Allow 2 weeks before returning to clinic for additional dosing as needed. Patient will call for any problems.  Return in about 4 months (around 06/29/2023) for Botox injections.  Maylene Roes, CMA, am acting as scribe for Armida Sans, MD .  Documentation: I have reviewed the above documentation for accuracy and completeness, and I agree with the above.  Armida Sans, MD

## 2023-02-26 NOTE — Patient Instructions (Signed)
Due to recent changes in healthcare laws, you may see results of your pathology and/or laboratory studies on MyChart before the doctors have had a chance to review them. We understand that in some cases there may be results that are confusing or concerning to you. Please understand that not all results are received at the same time and often the doctors may need to interpret multiple results in order to provide you with the best plan of care or course of treatment. Therefore, we ask that you please give us 2 business days to thoroughly review all your results before contacting the office for clarification. Should we see a critical lab result, you will be contacted sooner.   If You Need Anything After Your Visit  If you have any questions or concerns for your doctor, please call our main line at [phone] and press option 4 to reach your doctor's medical assistant. If no one answers, please leave a voicemail as directed and we will return your call as soon as possible. Messages left after 4 pm will be answered the following business day.   You may also send us a message via MyChart. We typically respond to MyChart messages within 1-2 business days.  For prescription refills, please ask your pharmacy to contact our office. Our fax number is [phone].  If you have an urgent issue when the clinic is closed that cannot wait until the next business day, you can page your doctor at the number below.    Please note that while we do our best to be available for urgent issues outside of office hours, we are not available 24/7.   If you have an urgent issue and are unable to reach us, you may choose to seek medical care at your doctor's office, retail clinic, urgent care center, or emergency room.  If you have a medical emergency, please immediately call 911 or go to the emergency department.  Pager Numbers  - Dr. Kowalski: [phone]  - Dr. Moye: [phone]  - Dr. Stewart:  [phone]  In the event of inclement weather, please call our main line at [phone] for an update on the status of any delays or closures.  Dermatology Medication Tips: Please keep the boxes that topical medications come in in order to help keep track of the instructions about where and how to use these. Pharmacies typically print the medication instructions only on the boxes and not directly on the medication tubes.   If your medication is too expensive, please contact our office at [phone] option 4 or send us a message through MyChart.   We are unable to tell what your co-pay for medications will be in advance as this is different depending on your insurance coverage. However, we may be able to find a substitute medication at lower cost or fill out paperwork to get insurance to cover a needed medication.   If a prior authorization is required to get your medication covered by your insurance company, please allow us 1-2 business days to complete this process.  Drug prices often vary depending on where the prescription is filled and some pharmacies may offer cheaper prices.  The website www.goodrx.com contains coupons for medications through different pharmacies. The prices here do not account for what the cost may be with help from insurance (it may be cheaper with your insurance), but the website can give you the price if you did not use any insurance.  - You can print the associated coupon and take it with   your prescription to the pharmacy.  - You may also stop by our office during regular business hours and pick up a GoodRx coupon card.  - If you need your prescription sent electronically to a different pharmacy, notify our office through [facility] MyChart or by phone at [phone] option 4.     Si Usted Necesita Algo Despus de Su Visita  Tambin puede enviarnos un mensaje a travs de MyChart. Por lo general respondemos a los mensajes de MyChart en el transcurso de 1 a 2  das hbiles.  Para renovar recetas, por favor pida a su farmacia que se ponga en contacto con nuestra oficina. Nuestro nmero de fax es el [phone].  Si tiene un asunto urgente cuando la clnica est cerrada y que no puede esperar hasta el siguiente da hbil, puede llamar/localizar a su doctor(a) al nmero que aparece a continuacin.   Por favor, tenga en cuenta que aunque hacemos todo lo posible para estar disponibles para asuntos urgentes fuera del horario de oficina, no estamos disponibles las 24 horas del da, los 7 das de la semana.   Si tiene un problema urgente y no puede comunicarse con nosotros, puede optar por buscar atencin mdica  en el consultorio de su doctor(a), en una clnica privada, en un centro de atencin urgente o en una sala de emergencias.  Si tiene una emergencia mdica, por favor llame inmediatamente al 911 o vaya a la sala de emergencias.  Nmeros de bper  - Dr. Kowalski: [phone]  - Dra. Moye: [phone]  - Dra. Stewart: [phone]  En caso de inclemencias del tiempo, por favor llame a nuestra lnea principal al [phone] para una actualizacin sobre el estado de cualquier retraso o cierre.  Consejos para la medicacin en dermatologa: Por favor, guarde las cajas en las que vienen los medicamentos de uso tpico para ayudarle a seguir las instrucciones sobre dnde y cmo usarlos. Las farmacias generalmente imprimen las instrucciones del medicamento slo en las cajas y no directamente en los tubos del medicamento.   Si su medicamento es muy caro, por favor, pngase en contacto con nuestra oficina llamando al [phone] y presione la opcin 4 o envenos un mensaje a travs de MyChart.   No podemos decirle cul ser su copago por los medicamentos por adelantado ya que esto es diferente dependiendo de la cobertura de su seguro. Sin embargo, es posible que podamos encontrar un medicamento sustituto a menor costo o llenar un formulario para que el  seguro cubra el medicamento que se considera necesario.   Si se requiere una autorizacin previa para que su compaa de seguros cubra su medicamento, por favor permtanos de 1 a 2 das hbiles para completar este proceso.  Los precios de los medicamentos varan con frecuencia dependiendo del lugar de dnde se surte la receta y alguna farmacias pueden ofrecer precios ms baratos.  El sitio web www.goodrx.com tiene cupones para medicamentos de diferentes farmacias. Los precios aqu no tienen en cuenta lo que podra costar con la ayuda del seguro (puede ser ms barato con su seguro), pero el sitio web puede darle el precio si no utiliz ningn seguro.  - Puede imprimir el cupn correspondiente y llevarlo con su receta a la farmacia.  - Tambin puede pasar por nuestra oficina durante el horario de atencin regular y recoger una tarjeta de cupones de GoodRx.  - Si necesita que su receta se enve electrnicamente a una farmacia diferente, informe a nuestra oficina a travs de MyChart de [facility]   o por telfono llamando al [phone] y presione la opcin 4.  

## 2023-02-27 ENCOUNTER — Encounter: Payer: Self-pay | Admitting: Dermatology

## 2023-05-22 DIAGNOSIS — K5900 Constipation, unspecified: Secondary | ICD-10-CM | POA: Diagnosis not present

## 2023-05-22 DIAGNOSIS — R109 Unspecified abdominal pain: Secondary | ICD-10-CM | POA: Diagnosis not present

## 2023-05-22 DIAGNOSIS — R300 Dysuria: Secondary | ICD-10-CM | POA: Diagnosis not present

## 2023-06-10 ENCOUNTER — Institutional Professional Consult (permissible substitution) (HOSPITAL_BASED_OUTPATIENT_CLINIC_OR_DEPARTMENT_OTHER): Payer: BC Managed Care – PPO | Admitting: Internal Medicine

## 2023-07-02 ENCOUNTER — Ambulatory Visit (INDEPENDENT_AMBULATORY_CARE_PROVIDER_SITE_OTHER): Payer: Self-pay | Admitting: Dermatology

## 2023-07-02 DIAGNOSIS — L988 Other specified disorders of the skin and subcutaneous tissue: Secondary | ICD-10-CM

## 2023-07-02 NOTE — Progress Notes (Signed)
   Follow-Up Visit   Subjective  Brittany Hunt is a 47 y.o. female who presents for the following: Botox for facial elastosis  The following portions of the chart were reviewed this encounter and updated as appropriate: medications, allergies, medical history  Review of Systems:  No other skin or systemic complaints except as noted in HPI or Assessment and Plan.  Objective  Well appearing patient in no apparent distress; mood and affect are within normal limits.  A focused examination was performed of the face.  Relevant physical exam findings are noted in the Assessment and Plan.   Injection map photo     Assessment & Plan    Facial Elastosis  Botox 53 units injected Frown Complex 20 units Forehead  5 units Crow's Feet 10 units x 2 DAO's  4 units each side   Location: See attached image  Informed consent: Discussed risks (infection, pain, bleeding, bruising, swelling, allergic reaction, paralysis of nearby muscles, eyelid droop, double vision, neck weakness, difficulty breathing, headache, undesirable cosmetic result, and need for additional treatment) and benefits of the procedure, as well as the alternatives.  Informed consent was obtained.  Preparation: The area was cleansed with alcohol.  Procedure Details:  Botox was injected into the dermis with a 30-gauge needle. Pressure applied to any bleeding. Ice packs offered for swelling.  Lot Number:  [id number] Expiration:  04/2025  Total Units Injected:  53  Plan: Tylenol may be used for headache.  Allow 2 weeks before returning to clinic for additional dosing as needed. Patient will call for any problems.  SEBORRHEIC KERATOSIS - Stuck-on, waxy, tan-brown papules and/or plaques Multiple at b/l legs  - Benign-appearing - Discussed benign etiology and prognosis. - Observe - Call for any changes Recommend OTC Urea 40 % cream - apply topically as a spot treatment to legs daily. If not improving will discuss treating  at next appointment.   Discussed treatment with LN2 vs BBL  Counseling for BBL / IPL / Laser and Coordination of Care Discussed the treatment option of Broad Band Light (BBL) /Intense Pulsed Light (IPL)/ Laser for skin discoloration, including brown spots and redness.  Typically we recommend at least 1-3 treatment sessions about 5-8 weeks apart for best results.  Cannot have tanned skin when BBL performed, and regular use of sunscreen/photoprotection is advised after the procedure to help maintain results. The patient's condition may also require "maintenance treatments" in the future.  The fee for BBL / laser treatments is $350 per treatment session for the whole face.  A fee can be quoted for other parts of the body.  Insurance typically does not pay for BBL/laser treatments and therefore the fee is an out-of-pocket cost. Recommend prophylactic valtrex treatment. Once scheduled for procedure, will send Rx in prior to patient's appointment.    Return for 3 - 4 month botox .  IAsher Muir, CMA, am acting as scribe for Armida Sans, MD.   Documentation: I have reviewed the above documentation for accuracy and completeness, and I agree with the above.  Armida Sans, MD

## 2023-07-02 NOTE — Patient Instructions (Addendum)
Recommend over the counter Urea 40 % cream to affected spots on legs found online. Use as a spot treatment on legs daily to help fade. If not resolved can       Seborrheic Keratosis  What causes seborrheic keratoses? Seborrheic keratoses are harmless, common skin growths that first appear during adult life.  As time goes by, more growths appear.  Some people may develop a large number of them.  Seborrheic keratoses appear on both covered and uncovered body parts.  They are not caused by sunlight.  The tendency to develop seborrheic keratoses can be inherited.  They vary in color from skin-colored to gray, brown, or even black.  They can be either smooth or have a rough, warty surface.   Seborrheic keratoses are superficial and look as if they were stuck on the skin.  Under the microscope this type of keratosis looks like layers upon layers of skin.  That is why at times the top layer may seem to fall off, but the rest of the growth remains and re-grows.    Treatment Seborrheic keratoses do not need to be treated, but can easily be removed in the office.  Seborrheic keratoses often cause symptoms when they rub on clothing or jewelry.  Lesions can be in the way of shaving.  If they become inflamed, they can cause itching, soreness, or burning.  Removal of a seborrheic keratosis can be accomplished by freezing, burning, or surgery. If any spot bleeds, scabs, or grows rapidly, please return to have it checked, as these can be an indication of a skin cancer.    Due to recent changes in healthcare laws, you may see results of your pathology and/or laboratory studies on MyChart before the doctors have had a chance to review them. We understand that in some cases there may be results that are confusing or concerning to you. Please understand that not all results are received at the same time and often the doctors may need to interpret multiple results in order to provide you with the best plan of care or  course of treatment. Therefore, we ask that you please give [place] 2 business days to thoroughly review all your results before contacting the office for clarification. Should we see a critical lab result, you will be contacted sooner.   If You Need Anything After Your Visit  If you have any questions or concerns for your doctor, please call our main line at [phone] and press option 4 to reach your doctor's medical assistant. If no one answers, please leave a voicemail as directed and we will return your call as soon as possible. Messages left after 4 pm will be answered the following business day.   You may also send [place] a message via MyChart. We typically respond to MyChart messages within 1-2 business days.  For prescription refills, please ask your pharmacy to contact our office. Our fax number is [phone].  If you have an urgent issue when the clinic is closed that cannot wait until the next business day, you can page your doctor at the number below.    Please note that while we do our best to be available for urgent issues outside of office hours, we are not available 24/7.   If you have an urgent issue and are unable to reach [place], you may choose to seek medical care at your doctor's office, retail clinic, urgent care center, or emergency room.  If you have a medical emergency, please immediately call  911 or go to the emergency department.  Pager Numbers  - Dr. Gwen Pounds: [phone]  - Dr. Roseanne Reno: [phone]  - Dr. Katrinka Blazing: [phone]   In the event of inclement weather, please call our main line at [phone] for an update on the status of any delays or closures.  Dermatology Medication Tips: Please keep the boxes that topical medications come in in order to help keep track of the instructions about where and how to use these. Pharmacies typically print the medication instructions only on the boxes and not directly on the medication tubes.   If your medication is too  expensive, please contact our office at [phone] option 4 or send [place] a message through MyChart.   We are unable to tell what your co-pay for medications will be in advance as this is different depending on your insurance coverage. However, we may be able to find a substitute medication at lower cost or fill out paperwork to get insurance to cover a needed medication.   If a prior authorization is required to get your medication covered by your insurance company, please allow [place] 1-2 business days to complete this process.  Drug prices often vary depending on where the prescription is filled and some pharmacies may offer cheaper prices.  The website www.goodrx.com contains coupons for medications through different pharmacies. The prices here do not account for what the cost may be with help from insurance (it may be cheaper with your insurance), but the website can give you the price if you did not use any insurance.  - You can print the associated coupon and take it with your prescription to the pharmacy.  - You may also stop by our office during regular business hours and pick up a GoodRx coupon card.  - If you need your prescription sent electronically to a different pharmacy, notify our office through [facility] or by phone at [phone] option 4.     Si Usted Necesita Algo Despus de Su Visita  Tambin puede enviarnos un mensaje a travs de Clinical cytogeneticist. Por lo general respondemos a los mensajes de MyChart en el transcurso de 1 a 2 das hbiles.  Para renovar recetas, por favor pida a su farmacia que se ponga en contacto con nuestra oficina. [name] de fax es [place] [phone].  Si tiene un asunto urgente cuando la clnica est cerrada y que no puede esperar hasta el siguiente da hbil, puede llamar/localizar a su doctor(a) al nmero que aparece a continuacin.   Por favor, tenga en cuenta que aunque hacemos todo lo posible para estar disponibles para asuntos urgentes fuera  del horario de [place], no estamos disponibles las 24 horas del da, los 7 [address]  [address] de la [place].   Si tiene un problema urgente y no puede comunicarse con nosotros, puede optar por buscar atencin mdica  en el consultorio de su doctor(a), en una clnica privada, en un centro de atencin urgente o en una sala de emergencias.  Si tiene Engineer, drilling, por favor llame inmediatamente al 911 o vaya a la sala de emergencias.  Nmeros de bper  - Dr. Gwen Pounds: [phone]  - Dra. Roseanne Reno: [phone]  - Dr. Katrinka Blazing: [phone]   En caso de inclemencias del tiempo, por favor llame a Lacy Duverney principal al [phone] para una actualizacin sobre el [place] de cualquier retraso o cierre.  Consejos para la medicacin en dermatologa: Por favor, guarde las cajas en las que vienen los medicamentos de uso tpico para ayudarle a Designer, jewellery las  instrucciones sobre dnde y cmo usarlos. Las farmacias generalmente imprimen las instrucciones del medicamento slo en las cajas y no directamente en los tubos del [place].   Si su medicamento es muy caro, por favor, pngase en contacto con Rolm Gala llamando al [phone] y presione la opcin 4 o envenos un mensaje a travs de Clinical cytogeneticist.   No podemos decirle cul ser su copago por los medicamentos por adelantado ya que esto es diferente dependiendo de la cobertura de su seguro. Sin embargo, es posible que podamos encontrar un medicamento sustituto a Audiological scientist un formulario para que el seguro cubra el medicamento que se considera necesario.   Si se requiere una autorizacin previa para que su compaa de seguros [place] su medicamento, por favor permtanos de 1 a 2 das hbiles para completar [address]  Los precios de los medicamentos varan con frecuencia dependiendo del Environmental consultant de dnde se surte la receta y alguna farmacias pueden ofrecer precios ms baratos.  El sitio web www.goodrx.com tiene cupones para medicamentos de  Health and safety inspector. Los precios aqu no tienen en cuenta lo que podra costar con la ayuda del seguro (puede ser ms barato con su seguro), pero el sitio web puede darle el precio si no utiliz Tourist information centre manager.  - Puede imprimir el cupn correspondiente y llevarlo con su receta a la farmacia.  - Tambin puede pasar por nuestra oficina durante el horario de atencin regular y Education officer, museum una tarjeta de cupones de GoodRx.  - Si necesita que su receta se enve electrnicamente a una farmacia diferente, informe a nuestra oficina a travs de MyChart de [place] o por telfono llamando al [phone] y presione la opcin 4.

## 2023-07-09 ENCOUNTER — Encounter: Payer: Self-pay | Admitting: Dermatology

## 2023-07-16 DIAGNOSIS — Z0000 Encounter for general adult medical examination without abnormal findings: Secondary | ICD-10-CM | POA: Diagnosis not present

## 2023-07-16 DIAGNOSIS — R7303 Prediabetes: Secondary | ICD-10-CM | POA: Diagnosis not present

## 2023-07-16 DIAGNOSIS — F419 Anxiety disorder, unspecified: Secondary | ICD-10-CM | POA: Diagnosis not present

## 2023-07-16 DIAGNOSIS — E7800 Pure hypercholesterolemia, unspecified: Secondary | ICD-10-CM | POA: Diagnosis not present

## 2023-07-16 DIAGNOSIS — Z1211 Encounter for screening for malignant neoplasm of colon: Secondary | ICD-10-CM | POA: Diagnosis not present

## 2023-08-03 DIAGNOSIS — R10819 Abdominal tenderness, unspecified site: Secondary | ICD-10-CM | POA: Diagnosis not present

## 2023-08-03 DIAGNOSIS — K5900 Constipation, unspecified: Secondary | ICD-10-CM | POA: Diagnosis not present

## 2023-08-03 DIAGNOSIS — D72829 Elevated white blood cell count, unspecified: Secondary | ICD-10-CM | POA: Diagnosis not present

## 2023-08-03 DIAGNOSIS — R300 Dysuria: Secondary | ICD-10-CM | POA: Diagnosis not present

## 2023-08-03 DIAGNOSIS — R051 Acute cough: Secondary | ICD-10-CM | POA: Diagnosis not present

## 2023-08-03 DIAGNOSIS — J329 Chronic sinusitis, unspecified: Secondary | ICD-10-CM | POA: Diagnosis not present

## 2023-08-03 DIAGNOSIS — J019 Acute sinusitis, unspecified: Secondary | ICD-10-CM | POA: Diagnosis not present

## 2023-08-03 DIAGNOSIS — R109 Unspecified abdominal pain: Secondary | ICD-10-CM | POA: Diagnosis not present

## 2023-08-05 ENCOUNTER — Other Ambulatory Visit: Payer: Self-pay | Admitting: Obstetrics and Gynecology

## 2023-08-05 DIAGNOSIS — Z1231 Encounter for screening mammogram for malignant neoplasm of breast: Secondary | ICD-10-CM

## 2023-09-11 ENCOUNTER — Ambulatory Visit: Payer: BC Managed Care – PPO

## 2023-09-13 DIAGNOSIS — R197 Diarrhea, unspecified: Secondary | ICD-10-CM | POA: Diagnosis not present

## 2023-09-13 DIAGNOSIS — K625 Hemorrhage of anus and rectum: Secondary | ICD-10-CM | POA: Diagnosis not present

## 2023-09-23 DIAGNOSIS — R0683 Snoring: Secondary | ICD-10-CM | POA: Diagnosis not present

## 2023-09-23 DIAGNOSIS — E7800 Pure hypercholesterolemia, unspecified: Secondary | ICD-10-CM | POA: Diagnosis not present

## 2023-09-23 DIAGNOSIS — F329 Major depressive disorder, single episode, unspecified: Secondary | ICD-10-CM | POA: Diagnosis not present

## 2023-09-23 DIAGNOSIS — E669 Obesity, unspecified: Secondary | ICD-10-CM | POA: Diagnosis not present

## 2023-09-25 ENCOUNTER — Ambulatory Visit
Admission: RE | Admit: 2023-09-25 | Discharge: 2023-09-25 | Disposition: A | Discharge status: Home / Self Care | Payer: BC Managed Care – PPO | Source: Ambulatory Visit | Attending: Obstetrics and Gynecology | Admitting: Obstetrics and Gynecology

## 2023-09-25 DIAGNOSIS — Z1231 Encounter for screening mammogram for malignant neoplasm of breast: Secondary | ICD-10-CM

## 2023-09-30 ENCOUNTER — Encounter: Payer: Self-pay | Admitting: Obstetrics and Gynecology

## 2023-10-01 ENCOUNTER — Other Ambulatory Visit: Payer: Self-pay | Admitting: Obstetrics and Gynecology

## 2023-10-01 DIAGNOSIS — R928 Other abnormal and inconclusive findings on diagnostic imaging of breast: Secondary | ICD-10-CM

## 2023-10-02 ENCOUNTER — Ambulatory Visit: Payer: BC Managed Care – PPO

## 2023-10-02 ENCOUNTER — Ambulatory Visit
Admission: RE | Admit: 2023-10-02 | Discharge: 2023-10-02 | Disposition: A | Discharge status: Home / Self Care | Payer: BC Managed Care – PPO | Source: Ambulatory Visit | Attending: Obstetrics and Gynecology | Admitting: Obstetrics and Gynecology

## 2023-10-02 ENCOUNTER — Encounter: Payer: Self-pay | Admitting: Obstetrics and Gynecology

## 2023-10-02 DIAGNOSIS — R928 Other abnormal and inconclusive findings on diagnostic imaging of breast: Secondary | ICD-10-CM | POA: Diagnosis not present

## 2023-11-05 ENCOUNTER — Encounter: Payer: Self-pay | Admitting: Dermatology

## 2023-11-05 ENCOUNTER — Ambulatory Visit (INDEPENDENT_AMBULATORY_CARE_PROVIDER_SITE_OTHER): Payer: BC Managed Care – PPO | Admitting: Dermatology

## 2023-11-05 DIAGNOSIS — Z79890 Hormone replacement therapy (postmenopausal): Secondary | ICD-10-CM | POA: Diagnosis not present

## 2023-11-05 DIAGNOSIS — L988 Other specified disorders of the skin and subcutaneous tissue: Secondary | ICD-10-CM

## 2023-11-05 DIAGNOSIS — E349 Endocrine disorder, unspecified: Secondary | ICD-10-CM | POA: Diagnosis not present

## 2023-11-05 NOTE — Progress Notes (Signed)
   Follow-Up Visit   Subjective  Brittany Hunt is a 48 y.o. female who presents for the following: Botox for facial elastosis  The following portions of the chart were reviewed this encounter and updated as appropriate: medications, allergies, medical history  Review of Systems:  No other skin or systemic complaints except as noted in HPI or Assessment and Plan.  Objective  Well appearing patient in no apparent distress; mood and affect are within normal limits.  A focused examination was performed of the face.  Relevant physical exam findings are noted in the Assessment and Plan.  Injection map photo     Assessment & Plan    Facial Elastosis  Botox 53 units injected today to: - Frown Complex 20 units - Forehead  5 units - Crow's Feet 10 units x 2 - DAO's  4 units x 2  Location: frown complex, forehad, crow's feet, DAO's  Informed consent: Discussed risks (infection, pain, bleeding, bruising, swelling, allergic reaction, paralysis of nearby muscles, eyelid droop, double vision, neck weakness, difficulty breathing, headache, undesirable cosmetic result, and need for additional treatment) and benefits of the procedure, as well as the alternatives.  Informed consent was obtained.  Preparation: The area was cleansed with alcohol.  Procedure Details:  Botox was injected into the dermis with a 30-gauge needle. Pressure applied to any bleeding. Ice packs offered for swelling.  Lot Number:  [id number] Expiration:  09/2025  Total Units Injected:  53  Plan: Tylenol may be used for headache.  Allow 2 weeks before returning to clinic for additional dosing as needed. Patient will call for any problems.  Return in about 3 months (around 02/05/2024) for Botox.  I, Ardis Rowan, RMA, am acting as scribe for Armida Sans, MD .   Documentation: I have reviewed the above documentation for accuracy and completeness, and I agree with the above.  Armida Sans, MD

## 2023-11-05 NOTE — Patient Instructions (Signed)

## 2023-11-13 NOTE — Progress Notes (Deleted)
 48 y.o. G20P0102 Married Caucasian female here for NEW GYN/annual exam.    PCP: Milus Height, PA   No LMP recorded.           Sexually active: Yes.    The current method of family planning is OCP (estrogen/progesterone).    Menopausal hormone therapy:  n/a Exercising: {yes no:314532}  {types:19826} Smoker:  no  OB History  Gravida Para Term Preterm AB Living  1 1 0 1 0 2  SAB IAB Ectopic Multiple Live Births  0 0 0 1 2    # Outcome Date GA Lbr Len/2nd Weight Sex Type Anes PTL Lv  1A Preterm 2005    M CS-Unspec   LIV  1B Preterm 2005    M CS-Unspec   LIV     HEALTH MAINTENANCE: Last 2 paps:  05/15/17 neg: HR HPV neg, 11/20/10 neg History of abnormal Pap or positive HPV:  yes, Hx of cryotherapy age 7  Mammogram:   10/02/23 Breast Density Cat C, BI-RADS CAT 1 neg Colonoscopy:  n/a Bone Density:  n/a  Result  n/a   Immunization History  Administered Date(s) Administered   Influenza Split 05/17/2014   Influenza-Unspecified 05/02/2017, 06/03/2018, 08/31/2019, 05/20/2020   Tdap 11/13/2012      reports that she has never smoked. She has never used smokeless tobacco. She reports current alcohol use of about 1.0 standard drink of alcohol per week. She reports that she does not use drugs.  Past Medical History:  Diagnosis Date   Anxiety    Elevated ALT measurement 2019   Endometriosis 2004   Hyperlipidemia 2016   Infertility, female 2005   INVETRO @ [facility]    Past Surgical History:  Procedure Laterality Date   CESAREAN SECTION  2005   [facility]   PELVIC LAPAROSCOPY  2004    Current Outpatient Medications  Medication Sig Dispense Refill   ALPRAZolam (XANAX) 0.25 MG tablet Take 0.25 mg by mouth at bedtime.   0   amoxicillin-clavulanate (AUGMENTIN) 875-125 MG tablet Take 1 tablet by mouth every 12 (twelve) hours. (Patient not taking: Reported on 02/26/2023) 14 tablet 0   cyclobenzaprine (FLEXERIL) 5 MG tablet Take 1 tablet (5 mg total) by mouth 3 (three) times daily as  needed for muscle spasms. 20 tablet 0   fenofibrate (TRICOR) 145 MG tablet Take 1 tablet by mouth daily.  1   Melatonin 10 MG CAPS Take 1 capsule by mouth at bedtime.     norethindrone-ethinyl estradiol-FE (JUNEL FE 1/20) 1-20 MG-MCG tablet Take 1 tablet by mouth daily. 84 tablet 0   omega-3 acid ethyl esters (LOVAZA) 1 g capsule Take 1 g by mouth daily.     predniSONE (STERAPRED UNI-PAK 21 TAB) 10 MG (21) TBPK tablet Take by mouth daily. Take 6 tabs by mouth daily for 1 day, then 5 tabs for 1 day, then 4 tabs for 1 day, then 3 tabs for 1 day, then 2 tabs for 1 day, then 1 tab by mouth daily for 1 day 21 tablet 0   No current facility-administered medications for this visit.    ALLERGIES: Codeine  Family History  Problem Relation Age of Onset   Diabetes Mother    Thyroid disease Mother    Thyroid disease Sister    Cancer Sister        Dec Lung Ca age 1   Heart failure Maternal Grandmother    Heart disease Paternal Grandmother    Heart attack Sister 53  had pacemaker placed   Diabetes Sister 51       Insulin dependent   Breast cancer Neg Hx     Review of Systems  PHYSICAL EXAM:  There were no vitals taken for this visit.    General appearance: alert, cooperative and appears stated age Head: normocephalic, without obvious abnormality, atraumatic Neck: no adenopathy, supple, symmetrical, trachea midline and thyroid normal to inspection and palpation Lungs: clear to auscultation bilaterally Breasts: normal appearance, no masses or tenderness, No nipple retraction or dimpling, No nipple discharge or bleeding, No axillary adenopathy Heart: regular rate and rhythm Abdomen: soft, non-tender; no masses, no organomegaly Extremities: extremities normal, atraumatic, no cyanosis or edema Skin: skin color, texture, turgor normal. No rashes or lesions Lymph nodes: cervical, supraclavicular, and axillary nodes normal. Neurologic: grossly normal  Pelvic: External genitalia:  no  lesions              No abnormal inguinal nodes palpated.              Urethra:  normal appearing urethra with no masses, tenderness or lesions              Bartholins and Skenes: normal                 Vagina: normal appearing vagina with normal color and discharge, no lesions              Cervix: no lesions              Pap taken: {yes no:314532} Bimanual Exam:  Uterus:  normal size, contour, position, consistency, mobility, non-tender              Adnexa: no mass, fullness, tenderness              Rectal exam: {yes no:314532}.  Confirms.              Anus:  normal sphincter tone, no lesions  Chaperone was present for exam:  {BSCHAPERONE:31226::"Shaylah Mcghie F, CMA"}  ASSESSMENT: Well woman visit with gynecologic exam.  PHQ-9: ***  ***  PLAN: Mammogram screening discussed. Self breast awareness reviewed. Pap and HRV collected:  {yes no:314532} Guidelines for Calcium, Vitamin D, regular exercise program including cardiovascular and weight bearing exercise. Medication refills:  *** {LABS (Optional):23779} Follow up:  ***    Additional counseling given.  {yes [id number]. ***  total time was spent for this patient encounter, including preparation, face-to-face counseling with the patient, coordination of care, and documentation of the encounter in addition to doing the well woman visit with gynecologic exam.

## 2023-11-27 ENCOUNTER — Encounter: Payer: BC Managed Care – PPO | Admitting: Obstetrics and Gynecology

## 2024-01-08 ENCOUNTER — Encounter: Admitting: Obstetrics and Gynecology

## 2024-02-11 ENCOUNTER — Encounter: Payer: Self-pay | Admitting: Dermatology

## 2024-02-11 ENCOUNTER — Ambulatory Visit (INDEPENDENT_AMBULATORY_CARE_PROVIDER_SITE_OTHER): Admitting: Dermatology

## 2024-02-11 DIAGNOSIS — L821 Other seborrheic keratosis: Secondary | ICD-10-CM

## 2024-02-11 DIAGNOSIS — L820 Inflamed seborrheic keratosis: Secondary | ICD-10-CM

## 2024-02-11 DIAGNOSIS — D229 Melanocytic nevi, unspecified: Secondary | ICD-10-CM

## 2024-02-11 DIAGNOSIS — L814 Other melanin hyperpigmentation: Secondary | ICD-10-CM

## 2024-02-11 DIAGNOSIS — D2272 Melanocytic nevi of left lower limb, including hip: Secondary | ICD-10-CM

## 2024-02-11 DIAGNOSIS — W908XXA Exposure to other nonionizing radiation, initial encounter: Secondary | ICD-10-CM | POA: Diagnosis not present

## 2024-02-11 DIAGNOSIS — L988 Other specified disorders of the skin and subcutaneous tissue: Secondary | ICD-10-CM

## 2024-02-11 DIAGNOSIS — L578 Other skin changes due to chronic exposure to nonionizing radiation: Secondary | ICD-10-CM

## 2024-02-11 NOTE — Patient Instructions (Addendum)
 Cryotherapy Aftercare  Wash gently with soap and water everyday.   Apply Vaseline and Band-Aid daily until healed.    Seborrheic Keratosis  What causes seborrheic keratoses? Seborrheic keratoses are harmless, common skin growths that first appear during adult life.  As time goes by, more growths appear.  Some people may develop a large number of them.  Seborrheic keratoses appear on both covered and uncovered body parts.  They are not caused by sunlight.  The tendency to develop seborrheic keratoses can be inherited.  They vary in color from skin-colored to gray, brown, or even black.  They can be either smooth or have a rough, warty surface.   Seborrheic keratoses are superficial and look as if they were stuck on the skin.  Under the microscope this type of keratosis looks like layers upon layers of skin.  That is why at times the top layer may seem to fall off, but the rest of the growth remains and re-grows.    Treatment Seborrheic keratoses do not need to be treated, but can easily be removed in the office.  Seborrheic keratoses often cause symptoms when they rub on clothing or jewelry.  Lesions can be in the way of shaving.  If they become inflamed, they can cause itching, soreness, or burning.  Removal of a seborrheic keratosis can be accomplished by freezing, burning, or surgery. If any spot bleeds, scabs, or grows rapidly, please return to have it checked, as these can be an indication of a skin cancer.   Melanoma ABCDEs  Melanoma is the most dangerous type of skin cancer, and is the leading cause of death from skin disease.  You are more likely to develop melanoma if you: Have light-colored skin, light-colored eyes, or red or blond hair Spend a lot of time in the sun Tan regularly, either outdoors or in a tanning bed Have had blistering sunburns, especially during childhood Have a close family member who has had a melanoma Have atypical moles or large birthmarks  Early detection  of melanoma is key since treatment is typically straightforward and cure rates are extremely high if we catch it early.   The first sign of melanoma is often a change in a mole or a new dark spot.  The ABCDE system is a way of remembering the signs of melanoma.  A for asymmetry:  The two halves do not match. B for border:  The edges of the growth are irregular. C for color:  A mixture of colors are present instead of an even brown color. D for diameter:  Melanomas are usually (but not always) greater than 6mm - the size of a pencil eraser. E for evolution:  The spot keeps changing in size, shape, and color.  Please check your skin once per month between visits. You can use a small mirror in front and a large mirror behind you to keep an eye on the back side or your body.   If you see any new or changing lesions before your next follow-up, please call to schedule a visit.  Please continue daily skin protection including broad spectrum sunscreen SPF 30+ to sun-exposed areas, reapplying every 2 hours as needed when you're outdoors.   Staying in the shade or wearing long sleeves, sun glasses (UVA+UVB protection) and wide brim hats (4-inch brim around the entire circumference of the hat) are also recommended for sun protection.    Due to recent changes in healthcare laws, you may see results of your pathology and/or  laboratory studies on MyChart before the doctors have had a chance to review them. We understand that in some cases there may be results that are confusing or concerning to you. Please understand that not all results are received at the same time and often the doctors may need to interpret multiple results in order to provide you with the best plan of care or course of treatment. Therefore, we ask that you please give [place] 2 business days to thoroughly review all your results before contacting the office for clarification. Should we see a critical lab result, you will be contacted  sooner.   If You Need Anything After Your Visit  If you have any questions or concerns for your doctor, please call our main line at [phone] and press option 4 to reach your doctor's medical assistant. If no one answers, please leave a voicemail as directed and we will return your call as soon as possible. Messages left after 4 pm will be answered the following business day.   You may also send [place] a message via MyChart. We typically respond to MyChart messages within 1-2 business days.  For prescription refills, please ask your pharmacy to contact our office. Our fax number is [phone].  If you have an urgent issue when the clinic is closed that cannot wait until the next business day, you can page your doctor at the number below.    Please note that while we do our best to be available for urgent issues outside of office hours, we are not available 24/7.   If you have an urgent issue and are unable to reach [place], you may choose to seek medical care at your doctor's office, retail clinic, urgent care center, or emergency room.  If you have a medical emergency, please immediately call 911 or go to the emergency department.  Pager Numbers  - Dr. Gwen Pounds: [phone]  - Dr. Roseanne Reno: [phone]  - Dr. Katrinka Blazing: [phone]   In the event of inclement weather, please call our main line at [phone] for an update on the status of any delays or closures.  Dermatology Medication Tips: Please keep the boxes that topical medications come in in order to help keep track of the instructions about where and how to use these. Pharmacies typically print the medication instructions only on the boxes and not directly on the medication tubes.   If your medication is too expensive, please contact our office at [phone] option 4 or send [place] a message through MyChart.   We are unable to tell what your co-pay for medications will be in advance as this is different depending on your  insurance coverage. However, we may be able to find a substitute medication at lower cost or fill out paperwork to get insurance to cover a needed medication.   If a prior authorization is required to get your medication covered by your insurance company, please allow [place] 1-2 business days to complete this process.  Drug prices often vary depending on where the prescription is filled and some pharmacies may offer cheaper prices.  The website www.goodrx.com contains coupons for medications through different pharmacies. The prices here do not account for what the cost may be with help from insurance (it may be cheaper with your insurance), but the website can give you the price if you did not use any insurance.  - You can print the associated coupon and take it with your prescription to the pharmacy.  - You may also stop by our  office during regular business hours and pick up a GoodRx coupon card.  - If you need your prescription sent electronically to a different pharmacy, notify our office through [facility] or by phone at [phone] option 4.     Si Usted Necesita Algo Despus de Su Visita  Tambin puede enviarnos un mensaje a travs de Clinical cytogeneticist. Por lo general respondemos a los mensajes de MyChart en el transcurso de 1 a 2 das hbiles.  Para renovar recetas, por favor pida a su farmacia que se ponga en contacto con nuestra oficina. [name] de fax es [place] [phone].  Si tiene un asunto urgente cuando la clnica est cerrada y que no puede esperar hasta el siguiente da hbil, puede llamar/localizar a su doctor(a) al nmero que aparece a continuacin.   Por favor, tenga en cuenta que aunque hacemos todo lo posible para estar disponibles para asuntos urgentes fuera del horario de [place], no estamos disponibles las 24 horas del da, los 7 [address]  [address] de la [place].   Si tiene un problema urgente y no puede comunicarse con nosotros, puede optar por buscar atencin mdica  en el  consultorio de su doctor(a), en una clnica privada, en un centro de atencin urgente o en una sala de emergencias.  Si tiene Engineer, drilling, por favor llame inmediatamente al 911 o vaya a la sala de emergencias.  Nmeros de bper  - Dr. Gwen Pounds: [phone]  - Dra. Roseanne Reno: [phone]  - Dr. Katrinka Blazing: [phone]   En caso de inclemencias del tiempo, por favor llame a Lacy Duverney principal al [phone] para una actualizacin sobre el [place] de cualquier retraso o cierre.  Consejos para la medicacin en dermatologa: Por favor, guarde las cajas en las que vienen los medicamentos de uso tpico para ayudarle a seguir las instrucciones sobre dnde y cmo usarlos. Las farmacias generalmente imprimen las instrucciones del medicamento slo en las cajas y no directamente en los tubos del [place].   Si su medicamento es muy caro, por favor, pngase en contacto con Rolm Gala llamando al [phone] y presione la opcin 4 o envenos un mensaje a travs de Clinical cytogeneticist.   No podemos decirle cul ser su copago por los medicamentos por adelantado ya que esto es diferente dependiendo de la cobertura de su seguro. Sin embargo, es posible que podamos encontrar un medicamento sustituto a Audiological scientist un formulario para que el seguro cubra el medicamento que se considera necesario.   Si se requiere una autorizacin previa para que su compaa de seguros [place] su medicamento, por favor permtanos de 1 a 2 das hbiles para completar [address]  Los precios de los medicamentos varan con frecuencia dependiendo del Environmental consultant de dnde se surte la receta y alguna farmacias pueden ofrecer precios ms baratos.  El sitio web www.goodrx.com tiene cupones para medicamentos de Health and safety inspector. Los precios aqu no tienen en cuenta lo que podra costar con la ayuda del seguro (puede ser ms barato con su seguro), pero el sitio web puede darle el precio si no utiliz Tourist information centre manager.  -  Puede imprimir el cupn correspondiente y llevarlo con su receta a la farmacia.  - Tambin puede pasar por nuestra oficina durante el horario de atencin regular y Education officer, museum una tarjeta de cupones de GoodRx.  - Si necesita que su receta se enve electrnicamente a una farmacia diferente, informe a nuestra oficina a travs de MyChart de [place] o por telfono llamando al [phone] y presione la opcin 4.

## 2024-02-11 NOTE — Progress Notes (Signed)
 Follow-Up Visit   Subjective  Brittany Hunt is a 48 y.o. female who presents for the following: Botox for facial elastosis Patient reports a spot at left arm and spot at upper left thigh she would like checked.   The patient has spots, moles and lesions to be evaluated, some may be new or changing and the patient may have concern these could be cancer.  The following portions of the chart were reviewed this encounter and updated as appropriate: medications, allergies, medical history  Review of Systems:  No other skin or systemic complaints except as noted in HPI or Assessment and Plan.  Objective  Well appearing patient in no apparent distress; mood and affect are within normal limits.  A focused examination was performed of the face. Also looked at left arm and left thigh   Relevant physical exam findings are noted in the Assessment and Plan.       Before botox photos if first time, Injection map photo    left forearm x 3 (3) Erythematous stuck-on, waxy papule or plaque  Assessment & Plan   INFLAMED SEBORRHEIC KERATOSIS (3) left forearm x 3 (3) Symptomatic, irritating, patient would like treated. Destruction of lesion - left forearm x 3 (3) Complexity: simple   Destruction method: cryotherapy   Informed consent: discussed and consent obtained   Timeout:  patient name, date of birth, surgical site, and procedure verified Lesion destroyed using liquid nitrogen: Yes   Region frozen until ice ball extended beyond lesion: Yes   Outcome: patient tolerated procedure well with no complications   Post-procedure details: wound care instructions given   ELASTOSIS OF SKIN   SEBORRHEIC KERATOSIS   ACTINIC SKIN DAMAGE   LENTIGO   MELANOCYTIC NEVUS, UNSPECIFIED LOCATION     Facial Elastosis  Location:  Botox total 53 units today  Frown Complex - 20 units  Forehead - 5 units Crows feet - 10 units x 2 DAO - 4 units x 2  Informed consent: Discussed  risks (infection, pain, bleeding, bruising, swelling, allergic reaction, paralysis of nearby muscles, eyelid droop, double vision, neck weakness, difficulty breathing, headache, undesirable cosmetic result, and need for additional treatment) and benefits of the procedure, as well as the alternatives.  Informed consent was obtained.  Preparation: The area was cleansed with alcohol.  Procedure Details:  Botox was injected into the dermis with a 30-gauge needle. Pressure applied to any bleeding. Ice packs offered for swelling.  Lot Number:  [id number] Expiration:  11/2025   Total Units Injected:  53  Plan: Tylenol may be used for headache.  Allow 2 weeks before returning to clinic for additional dosing as needed. Patient will call for any problems.   SEBORRHEIC KERATOSIS - Stuck-on, waxy, tan-brown papules and/or plaques  - Benign-appearing - Discussed benign etiology and prognosis. - Observe - Call for any changes  NEVUS VS LENTIGO  At left proximal anterior thigh  See photo Exam: 0.5 x 0.4 cm regular brown macule see photo at left proximal anterior thigh  Treatment Plan: Benign-appearing.  Observation.  Call clinic for new or changing lesions.  Recommend daily use of broad spectrum spf 30+ sunscreen to sun-exposed areas.   If changes discussed will consider biopsy   ACTINIC DAMAGE - chronic, secondary to cumulative UV radiation exposure/sun exposure over time - diffuse scaly erythematous macules with underlying dyspigmentation - Recommend daily broad spectrum sunscreen SPF 30+ to sun-exposed areas, reapply every 2 hours as needed.  - Recommend staying in the shade or  wearing long sleeves, sun glasses (UVA+UVB protection) and wide brim hats (4-inch brim around the entire circumference of the hat). - Call for new or changing lesions.   Return for 3 month botox.  IEleanor Blush, CMA, am acting as scribe for Alm Rhyme, MD.   Documentation: I have reviewed the above  documentation for accuracy and completeness, and I agree with the above.  Alm Rhyme, MD

## 2024-02-12 ENCOUNTER — Encounter: Admitting: Radiology

## 2024-02-27 ENCOUNTER — Ambulatory Visit
Admission: EM | Admit: 2024-02-27 | Discharge: 2024-02-27 | Disposition: A | Discharge status: Home / Self Care | Attending: Emergency Medicine | Admitting: Emergency Medicine

## 2024-02-27 DIAGNOSIS — R000 Tachycardia, unspecified: Secondary | ICD-10-CM

## 2024-02-27 NOTE — ED Triage Notes (Signed)
 Pt being seen in UC for concerns about being over heated yesterday that lead to nausea and one episode of vomiting this am. Pt reports after vomiting chest began to hurt. Pt reports having hx with anxiety.

## 2024-02-27 NOTE — Discharge Instructions (Signed)
 Your evaluated for episode of vomiting followed by chest discomfort which has resolved at this time  As you only vomited once and it was the food that you just consumed most likely related, chest discomfort could possibly of come from the intensity of the act of vomiting  Blood pressure is within normal limits  On her EKG it shows that her heart is beating in a normal rhythm however the rate is faster than what is expected ranging from 108-111, typical heart rate is 60-100 and per the chart review your heart rate is not typically elevated during doctor's visits  This could be related to your anxiety therefore please take your as needed medicine upon return home  Please schedule a follow-up appointment with your primary doctor for further evaluation and management  Please keep appointment tomorrow for blood work and they will notify you of any concerning values  Any point if your symptoms return and are more intense or severe please go to the nearest emergency department for immediate evaluation

## 2024-02-27 NOTE — ED Provider Notes (Addendum)
 Brittany Hunt    CSN: [id number] Arrival date & time: 02/27/24  1337      History   Chief Complaint Chief Complaint  Patient presents with   Chest Pain   Emesis    HPI Brittany Hunt is a 48 y.o. female.   Patient presents for evaluation of possibly overheating 1 day ago while outside.  Unsure if she is dehydrated.  Ate some eggs this morning and had immediate intense vomiting followed by centralized chest discomfort and floaters,  resolved within a few minutes and has not reoccurred.  Denies personal cardiac history, believed grandmother may have had a clogged artery in her 35s.  History of anxiety, has not taken medication.  Past Medical History:  Diagnosis Date   Anxiety    Elevated ALT measurement 2019   Endometriosis 2004   Hyperlipidemia 2016   Infertility, female 2005   INVETRO @ [facility]    There are no active problems to display for this patient.   Past Surgical History:  Procedure Laterality Date   CESAREAN SECTION  2005   [facility]   PELVIC LAPAROSCOPY  2004    OB History     Gravida  1   Para  1   Term  0   Preterm  1   AB  0   Living  2      SAB  0   IAB  0   Ectopic  0   Multiple  1   Live Births  2            Home Medications    Prior to Admission medications   Medication Sig Start Date End Date Taking? Authorizing Provider  ALPRAZolam (XANAX) 0.25 MG tablet Take 0.25 mg by mouth at bedtime.  11/29/14   [provider]  amoxicillin -clavulanate (AUGMENTIN ) 875-125 MG tablet Take 1 tablet by mouth every 12 (twelve) hours. Patient not taking: Reported on 02/26/2023 10/19/22   Immordino, Garnette, FNP  cyclobenzaprine  (FLEXERIL ) 5 MG tablet Take 1 tablet (5 mg total) by mouth 3 (three) times daily as needed for muscle spasms. 01/08/23   Immordino, Garnette, FNP  fenofibrate (TRICOR) 145 MG tablet Take 1 tablet by mouth daily. 05/05/17   [provider]  Melatonin 10 MG CAPS Take 1 capsule by mouth at  bedtime.    [provider]  norethindrone-ethinyl estradiol -FE (JUNEL FE 1/20) 1-20 MG-MCG tablet Take 1 tablet by mouth daily. Patient not taking: Reported on 02/27/2024 08/02/21   Jertson, Jill Evelyn, MD  omega-3 acid ethyl esters (LOVAZA) 1 g capsule Take 1 g by mouth daily. Patient not taking: Reported on 02/27/2024    [provider]  predniSONE  (STERAPRED UNI-PAK 21 TAB) 10 MG (21) TBPK tablet Take by mouth daily. Take 6 tabs by mouth daily for 1 day, then 5 tabs for 1 day, then 4 tabs for 1 day, then 3 tabs for 1 day, then 2 tabs for 1 day, then 1 tab by mouth daily for 1 day Patient not taking: Reported on 02/27/2024 01/08/23   Immordino, Garnette, FNP    Family History Family History  Problem Relation Age of Onset   Diabetes Mother    Thyroid  disease Mother    Thyroid  disease Sister    Cancer Sister        Dec Lung Ca age 70   Heart failure Maternal Grandmother    Heart disease Paternal Grandmother    Heart attack Sister 79  had pacemaker placed   Diabetes Sister 28       Insulin dependent   Breast cancer Neg Hx     Social History Social History   Tobacco Use   Smoking status: Never   Smokeless tobacco: Never  Vaping Use   Vaping status: Never Used  Substance Use Topics   Alcohol use: Yes    Alcohol/week: 1.0 standard drink of alcohol    Types: 1 Glasses of wine per week   Drug use: No     Allergies   Codeine   Review of Systems Review of Systems   Physical Exam Triage Vital Signs ED Triage Vitals  Encounter Vitals Group     BP 02/27/24 1356 (!) 138/98     Girls Systolic BP Percentile --      Girls Diastolic BP Percentile --      Boys Systolic BP Percentile --      Boys Diastolic BP Percentile --      Pulse Rate 02/27/24 1356 (!) 108     Resp 02/27/24 1356 16     Temp 02/27/24 1356 98.8 F (37.1 C)     Temp Source 02/27/24 1356 Oral     SpO2 02/27/24 1356 98 %     Weight --      Height --      Head Circumference --       Peak Flow --      Pain Score 02/27/24 1349 0     Pain Loc --      Pain Education --      Exclude from Growth Chart --    No data found.  Updated Vital Signs BP (!) 138/98 (BP Location: Right Arm)   Pulse (!) 108   Temp 98.8 F (37.1 C) (Oral)   Resp 16   SpO2 98%   Visual Acuity Right Eye Distance:   Left Eye Distance:   Bilateral Distance:    Right Eye Near:   Left Eye Near:    Bilateral Near:     Physical Exam Constitutional:      Appearance: Normal appearance.  Eyes:     Extraocular Movements: Extraocular movements intact.  Cardiovascular:     Rate and Rhythm: Regular rhythm. Tachycardia present.     Pulses: Normal pulses.     Heart sounds: Normal heart sounds.  Pulmonary:     Effort: Pulmonary effort is normal.     Breath sounds: Normal breath sounds.  Neurological:     Mental Status: She is alert and oriented to person, place, and time. Mental status is at baseline.      UC Treatments / Results  Labs (all labs ordered are listed, but only abnormal results are displayed) Labs Reviewed - No data to display  EKG   Radiology No results found.  Procedures Procedures (including critical care time)  Medications Ordered in UC Medications - No data to display  Initial Impression / Assessment and Plan / UC Course  I have reviewed the triage vital signs and the nursing notes.  Pertinent labs & imaging results that were available during my care of the patient were reviewed by me and considered in my medical decision making (see chart for details).  Tachycardia  Blood pressure 138/98 with pulse rate of 108 in triage, EKG shows sinus tachycardia at 111, abnormal finding, no prior EKG for comparison, per chart review of office visits heart rate is elevated, could be related to anxiety, advised patient to take her as needed  medication, at this time all symptoms have resolved and patient is stable, has HRT blood work including CMP and CBC tomorrow, advised to  keep upcoming appointment, advised PCP follow-up for repeat EKG and further evaluation, given strict ER precautions Final Clinical Impressions(s) / UC Diagnoses   Final diagnoses:  Tachycardia     Discharge Instructions      Your evaluated for episode of vomiting followed by chest discomfort which has resolved at this time  As you only vomited once and it was the food that you just consumed most likely related, chest discomfort could possibly of come from the intensity of the act of vomiting  Blood pressure is within normal limits  On her EKG it shows that her heart is beating in a normal rhythm however the rate is faster than what is expected ranging from 108-111, typical heart rate is 60-100 and per the chart review your heart rate is not typically elevated during doctor's visits  This could be related to your anxiety therefore please take your as needed medicine upon return home  Please schedule a follow-up appointment with your primary doctor for further evaluation and management  Please keep appointment tomorrow for blood work and they will notify you of any concerning values  Any point if your symptoms return and are more intense or severe please go to the nearest emergency department for immediate evaluation   ED Prescriptions   None    PDMP not reviewed this encounter.   Teresa Shelba SAUNDERS, NP 02/27/24 1454    Teresa Shelba SAUNDERS, NP 02/27/24 1455

## 2024-03-02 DIAGNOSIS — R030 Elevated blood-pressure reading, without diagnosis of hypertension: Secondary | ICD-10-CM | POA: Diagnosis not present

## 2024-03-02 DIAGNOSIS — R000 Tachycardia, unspecified: Secondary | ICD-10-CM | POA: Diagnosis not present

## 2024-03-02 DIAGNOSIS — F419 Anxiety disorder, unspecified: Secondary | ICD-10-CM | POA: Diagnosis not present

## 2024-03-02 DIAGNOSIS — G4719 Other hypersomnia: Secondary | ICD-10-CM | POA: Diagnosis not present

## 2024-03-09 DIAGNOSIS — Z6830 Body mass index (BMI) 30.0-30.9, adult: Secondary | ICD-10-CM | POA: Diagnosis not present

## 2024-03-09 DIAGNOSIS — R0683 Snoring: Secondary | ICD-10-CM | POA: Diagnosis not present

## 2024-03-09 DIAGNOSIS — Z8349 Family history of other endocrine, nutritional and metabolic diseases: Secondary | ICD-10-CM | POA: Diagnosis not present

## 2024-03-30 DIAGNOSIS — G4733 Obstructive sleep apnea (adult) (pediatric): Secondary | ICD-10-CM | POA: Diagnosis not present

## 2024-04-07 DIAGNOSIS — G4733 Obstructive sleep apnea (adult) (pediatric): Secondary | ICD-10-CM | POA: Diagnosis not present

## 2024-04-23 DIAGNOSIS — G4733 Obstructive sleep apnea (adult) (pediatric): Secondary | ICD-10-CM | POA: Diagnosis not present

## 2024-04-29 DIAGNOSIS — G4733 Obstructive sleep apnea (adult) (pediatric): Secondary | ICD-10-CM | POA: Diagnosis not present

## 2024-05-08 DIAGNOSIS — G4733 Obstructive sleep apnea (adult) (pediatric): Secondary | ICD-10-CM | POA: Diagnosis not present

## 2024-05-11 ENCOUNTER — Other Ambulatory Visit: Payer: Self-pay | Admitting: Physician Assistant

## 2024-05-11 DIAGNOSIS — R945 Abnormal results of liver function studies: Secondary | ICD-10-CM

## 2024-05-11 DIAGNOSIS — R7303 Prediabetes: Secondary | ICD-10-CM | POA: Diagnosis not present

## 2024-05-12 ENCOUNTER — Ambulatory Visit (INDEPENDENT_AMBULATORY_CARE_PROVIDER_SITE_OTHER): Payer: Self-pay | Admitting: Dermatology

## 2024-05-12 ENCOUNTER — Encounter: Payer: Self-pay | Admitting: Dermatology

## 2024-05-12 DIAGNOSIS — L988 Other specified disorders of the skin and subcutaneous tissue: Secondary | ICD-10-CM

## 2024-05-12 NOTE — Progress Notes (Signed)
   Follow-Up Visit   Subjective  Brittany Hunt is a 48 y.o. female who presents for the following: Botox for facial elastosis, recheck spot on L ant thigh  The following portions of the chart were reviewed this encounter and updated as appropriate: medications, allergies, medical history  Review of Systems:  No other skin or systemic complaints except as noted in HPI or Assessment and Plan.  Objective  Well appearing patient in no apparent distress; mood and affect are within normal limits.  A focused examination was performed of the face, L ant thigh  Relevant physical exam findings are noted in the Assessment and Plan.  Injection map photo     Assessment & Plan    Facial Elastosis Botox 53 units injected today to: - Frown Complex 20 units - Forehead  5 units - Crow's Feet 10 units x 2 - DAO's  4 units x 2  Location: frown complex, forehead, crow's feet, DAO's  Informed consent: Discussed risks (infection, pain, bleeding, bruising, swelling, allergic reaction, paralysis of nearby muscles, eyelid droop, double vision, neck weakness, difficulty breathing, headache, undesirable cosmetic result, and need for additional treatment) and benefits of the procedure, as well as the alternatives.  Informed consent was obtained.  Preparation: The area was cleansed with alcohol.  Procedure Details:  Botox was injected into the dermis with a 30-gauge needle. Pressure applied to any bleeding. Ice packs offered for swelling.  Lot Number:  [id number] Expiration:  05/2026  Total Units Injected:  53  Plan: Tylenol may be used for headache.  Allow 2 weeks before returning to clinic for additional dosing as needed. Patient will call for any problems.  NEVUS VS LENTIGO  Left proximal anterior thigh  Photo from 02/11/24 Exam: 0.5 x 0.4 cm regular brown macule see photo at left proximal anterior thigh  Treatment Plan: Benign-appearing. Stable compared to previous visit. Observation.   Call clinic for new or changing moles.  Recommend daily use of broad spectrum spf 30+ sunscreen to sun-exposed areas.    Return in 3 months (on 08/11/2024) for Botox.  I, Grayce Saunas, RMA, am acting as scribe for Alm Rhyme, MD .   Documentation: I have reviewed the above documentation for accuracy and completeness, and I agree with the above.  Alm Rhyme, MD

## 2024-05-12 NOTE — Patient Instructions (Signed)

## 2024-05-13 ENCOUNTER — Inpatient Hospital Stay
Admission: RE | Admit: 2024-05-13 | Discharge: 2024-05-13 | Discharge status: Home / Self Care | Source: Ambulatory Visit | Attending: Physician Assistant | Admitting: Physician Assistant

## 2024-05-13 DIAGNOSIS — K760 Fatty (change of) liver, not elsewhere classified: Secondary | ICD-10-CM | POA: Diagnosis not present

## 2024-05-13 DIAGNOSIS — R945 Abnormal results of liver function studies: Secondary | ICD-10-CM

## 2024-05-13 DIAGNOSIS — K8020 Calculus of gallbladder without cholecystitis without obstruction: Secondary | ICD-10-CM | POA: Diagnosis not present

## 2024-05-20 DIAGNOSIS — D7589 Other specified diseases of blood and blood-forming organs: Secondary | ICD-10-CM | POA: Diagnosis not present

## 2024-05-20 DIAGNOSIS — R7303 Prediabetes: Secondary | ICD-10-CM | POA: Diagnosis not present

## 2024-05-20 DIAGNOSIS — E559 Vitamin D deficiency, unspecified: Secondary | ICD-10-CM | POA: Diagnosis not present

## 2024-05-20 DIAGNOSIS — Z9189 Other specified personal risk factors, not elsewhere classified: Secondary | ICD-10-CM | POA: Diagnosis not present

## 2024-05-20 DIAGNOSIS — G4733 Obstructive sleep apnea (adult) (pediatric): Secondary | ICD-10-CM | POA: Diagnosis not present

## 2024-05-20 DIAGNOSIS — Z1331 Encounter for screening for depression: Secondary | ICD-10-CM | POA: Diagnosis not present

## 2024-05-20 DIAGNOSIS — E782 Mixed hyperlipidemia: Secondary | ICD-10-CM | POA: Diagnosis not present

## 2024-06-03 DIAGNOSIS — E782 Mixed hyperlipidemia: Secondary | ICD-10-CM | POA: Diagnosis not present

## 2024-06-03 DIAGNOSIS — E559 Vitamin D deficiency, unspecified: Secondary | ICD-10-CM | POA: Diagnosis not present

## 2024-06-03 DIAGNOSIS — R7303 Prediabetes: Secondary | ICD-10-CM | POA: Diagnosis not present

## 2024-06-03 DIAGNOSIS — G4733 Obstructive sleep apnea (adult) (pediatric): Secondary | ICD-10-CM | POA: Diagnosis not present

## 2024-06-12 DIAGNOSIS — K760 Fatty (change of) liver, not elsewhere classified: Secondary | ICD-10-CM | POA: Diagnosis not present

## 2024-06-12 DIAGNOSIS — R160 Hepatomegaly, not elsewhere classified: Secondary | ICD-10-CM | POA: Diagnosis not present

## 2024-06-12 DIAGNOSIS — R748 Abnormal levels of other serum enzymes: Secondary | ICD-10-CM | POA: Diagnosis not present

## 2024-06-17 DIAGNOSIS — R7303 Prediabetes: Secondary | ICD-10-CM | POA: Diagnosis not present

## 2024-06-17 DIAGNOSIS — G4733 Obstructive sleep apnea (adult) (pediatric): Secondary | ICD-10-CM | POA: Diagnosis not present

## 2024-06-17 DIAGNOSIS — R945 Abnormal results of liver function studies: Secondary | ICD-10-CM | POA: Diagnosis not present

## 2024-06-17 DIAGNOSIS — E782 Mixed hyperlipidemia: Secondary | ICD-10-CM | POA: Diagnosis not present

## 2024-06-23 DIAGNOSIS — G4733 Obstructive sleep apnea (adult) (pediatric): Secondary | ICD-10-CM | POA: Diagnosis not present

## 2024-07-23 DIAGNOSIS — G4733 Obstructive sleep apnea (adult) (pediatric): Secondary | ICD-10-CM | POA: Diagnosis not present

## 2024-07-30 DIAGNOSIS — Z0000 Encounter for general adult medical examination without abnormal findings: Secondary | ICD-10-CM | POA: Diagnosis not present

## 2024-07-30 DIAGNOSIS — R7303 Prediabetes: Secondary | ICD-10-CM | POA: Diagnosis not present

## 2024-07-30 DIAGNOSIS — G4733 Obstructive sleep apnea (adult) (pediatric): Secondary | ICD-10-CM | POA: Diagnosis not present

## 2024-07-30 DIAGNOSIS — Z23 Encounter for immunization: Secondary | ICD-10-CM | POA: Diagnosis not present

## 2024-07-30 DIAGNOSIS — F419 Anxiety disorder, unspecified: Secondary | ICD-10-CM | POA: Diagnosis not present

## 2024-07-30 DIAGNOSIS — E7800 Pure hypercholesterolemia, unspecified: Secondary | ICD-10-CM | POA: Diagnosis not present

## 2024-08-08 DIAGNOSIS — G4733 Obstructive sleep apnea (adult) (pediatric): Secondary | ICD-10-CM | POA: Diagnosis not present

## 2024-08-11 ENCOUNTER — Ambulatory Visit (INDEPENDENT_AMBULATORY_CARE_PROVIDER_SITE_OTHER): Payer: Self-pay | Admitting: Dermatology

## 2024-08-11 ENCOUNTER — Encounter: Payer: Self-pay | Admitting: Dermatology

## 2024-08-11 DIAGNOSIS — L988 Other specified disorders of the skin and subcutaneous tissue: Secondary | ICD-10-CM

## 2024-08-11 NOTE — Progress Notes (Signed)
" ° °  Follow-Up Visit   Subjective  Brittany Hunt is a 48 y.o. female who presents for the following: Botox for facial elastosis  The following portions of the chart were reviewed this encounter and updated as appropriate: medications, allergies, medical history  Review of Systems:  No other skin or systemic complaints except as noted in HPI or Assessment and Plan.  Objective  Well appearing patient in no apparent distress; mood and affect are within normal limits.  A focused examination was performed of the face.  Relevant physical exam findings are noted in the Assessment and Plan.     Assessment & Plan     Facial Elastosis  Botox 53 units injected today to: - Frown Complex 20 units - Forehead  5 units - Crow's Feet 10 units x 2 - DAO's  4 units x 2  Location: See attached image  Informed consent: Discussed risks (infection, pain, bleeding, bruising, swelling, allergic reaction, paralysis of nearby muscles, eyelid droop, double vision, neck weakness, difficulty breathing, headache, undesirable cosmetic result, and need for additional treatment) and benefits of the procedure, as well as the alternatives.  Informed consent was obtained.  Preparation: The area was cleansed with alcohol.  Procedure Details:  Botox was injected into the dermis with a 30-gauge needle. Pressure applied to any bleeding. Ice packs offered for swelling.  Lot Number:  [id number] Expiration:  06/2026  Total Units Injected:  53  Plan: Tylenol may be used for headache.  Allow 2 weeks before returning to clinic for additional dosing as needed. Patient will call for any problems.  Return in about 4 months (around 12/10/2024) for Botox.  LILLETTE Rosina Mayans, CMA, am acting as scribe for Alm Rhyme, MD .   Documentation: I have reviewed the above documentation for accuracy and completeness, and I agree with the above.  Alm Rhyme, MD      "

## 2024-08-11 NOTE — Patient Instructions (Signed)

## 2024-09-20 ENCOUNTER — Emergency Department
Admission: EM | Admit: 2024-09-20 | Discharge: 2024-09-20 | Disposition: A | Discharge status: Home / Self Care | Attending: Emergency Medicine | Admitting: Emergency Medicine

## 2024-09-20 ENCOUNTER — Other Ambulatory Visit: Payer: Self-pay

## 2024-09-20 DIAGNOSIS — R300 Dysuria: Secondary | ICD-10-CM | POA: Diagnosis present

## 2024-09-20 DIAGNOSIS — N3000 Acute cystitis without hematuria: Secondary | ICD-10-CM | POA: Insufficient documentation

## 2024-09-20 LAB — URINALYSIS, ROUTINE W REFLEX MICROSCOPIC
Bilirubin Urine: NEGATIVE
Glucose, UA: NEGATIVE mg/dL
Hgb urine dipstick: NEGATIVE
Ketones, ur: NEGATIVE mg/dL
Leukocytes,Ua: NEGATIVE
Nitrite: POSITIVE — AB
Protein, ur: NEGATIVE mg/dL
Specific Gravity, Urine: 1.003 — ABNORMAL LOW (ref 1.005–1.030)
pH: 7 (ref 5.0–8.0)

## 2024-09-20 LAB — POC URINE PREG, ED: Preg Test, Ur: NEGATIVE

## 2024-09-20 MED ORDER — CEPHALEXIN 500 MG PO CAPS: 500.0000 mg | ORAL_CAPSULE | Freq: Three times a day (TID) | ORAL | 0 refills | Status: AC

## 2024-09-20 NOTE — ED Triage Notes (Signed)
 C/O cloudy urine and urinary frequency.  [place] time in [place].

## 2024-09-20 NOTE — Discharge Instructions (Signed)
 Treating for UTI Return for fever, pain in back or any other concerns. F/u PCP for Blood pressure recheck since elevated here.

## 2024-09-20 NOTE — ED Provider Notes (Addendum)
 "  [facility] Provider Note    Event Date/Time   First MD Initiated Contact with Patient 09/20/24 [phone]     (approximate)   History   No chief complaint on file.   HPI  Brittany Hunt is a 49 y.o. female  wwho comes in with UTI symptoms.  NO h/o kidney stones. Did have sex with husband but no concerns for STDS.  Was also in hot tub. No back pain but does have pressure and burning with urination. Had test at home that was LE positive.  Physical Exam   Triage Vital Signs: ED Triage Vitals  Encounter Vitals Group     BP 09/20/24 0909 (!) 151/110     Girls Systolic BP Percentile --      Girls Diastolic BP Percentile --      Boys Systolic BP Percentile --      Boys Diastolic BP Percentile --      Pulse Rate 09/20/24 0909 95     Resp 09/20/24 0907 18     Temp 09/20/24 0907 97.9 F (36.6 C)     Temp Source 09/20/24 0907 Oral     SpO2 09/20/24 0907 99 %     Weight 09/20/24 0907 145 lb 1 oz (65.8 kg)     Height --      Head Circumference --      Peak Flow --      Pain Score 09/20/24 0907 0     Pain Loc --      Pain Education --      Exclude from Growth Chart --     Most recent vital signs: Vitals:   09/20/24 0907 09/20/24 0909  BP:  (!) 151/110  Pulse:  95  Resp: 18   Temp: 97.9 F (36.6 C)   SpO2: 99%      General: Awake, no distress.  CV:  Good peripheral perfusion.  Resp:  Normal effort.  Abd:  No distention. Soft and non tender  Other:  No CVA tenderness    ED Results / Procedures / Treatments   Labs (all labs ordered are listed, but only abnormal results are displayed) Labs Reviewed  URINE CULTURE  URINALYSIS, ROUTINE W REFLEX MICROSCOPIC  POC URINE PREG, ED     EKG  My interpretation of EKG:    RADIOLOGY I have reviewed the xray personally and interpreted    PROCEDURES:  Critical Care performed: No  Procedures   MEDICATIONS ORDERED IN ED: Medications - No data to display   IMPRESSION / MDM /  ASSESSMENT AND PLAN / ED COURSE  I reviewed the triage vital signs and the nursing notes.   Patient's presentation is most consistent with acute, uncomplicated illness.    Pt with symptoms of UTI. No concerns for STDS per patient. Will Preg test negative. Will rx for UTI and send for culture. No symptoms to suggest Kidney stone or pyelo.   Given nitrite positive although no wbc will treat as she has symptoms consistent with UTI and reports this has happened before where she hasn't had urines that looked clean but where a UTI.  BP elevated but can be rechecked outpt.    FINAL CLINICAL IMPRESSION(S) / ED DIAGNOSES   Final diagnoses:  Acute cystitis without hematuria     Rx / DC Orders   ED Discharge Orders     None        Note:  This document was prepared using Dragon voice recognition software  and may include unintentional dictation errors.   Ernest Ronal BRAVO, MD 09/20/24 [id number]    Ernest Ronal BRAVO, MD 09/20/24 [phone]  "

## 2024-09-21 LAB — URINE CULTURE: Culture: <10000 — AB

## 2024-11-10 ENCOUNTER — Ambulatory Visit: Admitting: Dermatology
